# Patient Record
Sex: Male | Born: 1965 | Race: Black or African American | Hispanic: No | Marital: Married | Smoking: Former smoker
Health system: Southern US, Community
[De-identification: ages and names within clinical notes are randomized; demographics above are authoritative.]

## PROBLEM LIST (undated history)

## (undated) DIAGNOSIS — E119 Type 2 diabetes mellitus without complications: Secondary | ICD-10-CM

## (undated) DIAGNOSIS — I10 Essential (primary) hypertension: Secondary | ICD-10-CM

---

## 2010-04-28 ENCOUNTER — Emergency Department: Payer: Self-pay | Admitting: Emergency Medicine

## 2010-05-02 ENCOUNTER — Emergency Department: Payer: Self-pay | Admitting: Unknown Physician Specialty

## 2011-01-17 DIAGNOSIS — F32A Depression, unspecified: Secondary | ICD-10-CM | POA: Diagnosis present

## 2011-01-17 DIAGNOSIS — E785 Hyperlipidemia, unspecified: Secondary | ICD-10-CM | POA: Insufficient documentation

## 2011-01-17 DIAGNOSIS — I10 Essential (primary) hypertension: Secondary | ICD-10-CM | POA: Diagnosis present

## 2011-01-17 DIAGNOSIS — G894 Chronic pain syndrome: Secondary | ICD-10-CM | POA: Diagnosis present

## 2011-01-17 DIAGNOSIS — E119 Type 2 diabetes mellitus without complications: Secondary | ICD-10-CM

## 2011-08-09 DIAGNOSIS — N529 Male erectile dysfunction, unspecified: Secondary | ICD-10-CM | POA: Insufficient documentation

## 2020-05-14 ENCOUNTER — Emergency Department
Admission: EM | Admit: 2020-05-14 | Discharge: 2020-05-15 | Disposition: A | Discharge status: Home / Self Care | Payer: BC Managed Care – PPO | Attending: Emergency Medicine | Admitting: Emergency Medicine

## 2020-05-14 ENCOUNTER — Other Ambulatory Visit: Payer: Self-pay

## 2020-05-14 ENCOUNTER — Emergency Department: Payer: BC Managed Care – PPO

## 2020-05-14 DIAGNOSIS — M549 Dorsalgia, unspecified: Secondary | ICD-10-CM | POA: Insufficient documentation

## 2020-05-14 DIAGNOSIS — Z8739 Personal history of other diseases of the musculoskeletal system and connective tissue: Secondary | ICD-10-CM | POA: Insufficient documentation

## 2020-05-14 DIAGNOSIS — J40 Bronchitis, not specified as acute or chronic: Secondary | ICD-10-CM

## 2020-05-14 DIAGNOSIS — J069 Acute upper respiratory infection, unspecified: Secondary | ICD-10-CM | POA: Insufficient documentation

## 2020-05-14 LAB — COMPREHENSIVE METABOLIC PANEL
ALT: 17 U/L (ref 0–44)
AST: 22 U/L (ref 15–41)
Albumin: 4.1 g/dL (ref 3.5–5.0)
Alkaline Phosphatase: 112 U/L (ref 38–126)
Anion gap: 11 (ref 5–15)
BUN: 7 mg/dL (ref 6–20)
CO2: 22 mmol/L (ref 22–32)
Calcium: 9.4 mg/dL (ref 8.9–10.3)
Chloride: 100 mmol/L (ref 98–111)
Creatinine, Ser: 0.88 mg/dL (ref 0.61–1.24)
GFR, Estimated: >60 mL/min (ref >60)
Glucose, Bld: 232 mg/dL — ABNORMAL HIGH (ref 70–99)
Potassium: 3.7 mmol/L (ref 3.5–5.1)
Sodium: 133 mmol/L — ABNORMAL LOW (ref 135–145)
Total Bilirubin: 0.5 mg/dL (ref 0.3–1.2)
Total Protein: 8.1 g/dL (ref 6.5–8.1)

## 2020-05-14 LAB — CBC
HCT: 41.1 % (ref 39.0–52.0)
Hemoglobin: 13.7 g/dL (ref 13.0–17.0)
MCH: 28.7 pg (ref 26.0–34.0)
MCHC: 33.3 g/dL (ref 30.0–36.0)
MCV: 86 fL (ref 80.0–100.0)
Platelets: 354 10*3/uL (ref 150–400)
RBC: 4.78 MIL/uL (ref 4.22–5.81)
RDW: 12.5 % (ref 11.5–15.5)
WBC: 7.3 10*3/uL (ref 4.0–10.5)
nRBC: 0 % (ref 0.0–0.2)

## 2020-05-14 MED ORDER — SODIUM CHLORIDE 0.9 % IV BOLUS
1000.0000 mL | Freq: Once | INTRAVENOUS | Status: AC
  Administered 2020-05-14: 1000 mL via INTRAVENOUS

## 2020-05-14 MED ORDER — MORPHINE SULFATE (PF) 4 MG/ML IV SOLN
4.0000 mg | Freq: Once | INTRAVENOUS | Status: AC
  Administered 2020-05-14: 4 mg via INTRAVENOUS
  Filled 2020-05-14: qty 1

## 2020-05-14 MED ORDER — IOHEXOL 350 MG/ML SOLN
100.0000 mL | Freq: Once | INTRAVENOUS | Status: AC | PRN
  Administered 2020-05-14: 100 mL via INTRAVENOUS

## 2020-05-14 MED ORDER — ONDANSETRON HCL 4 MG/2ML IJ SOLN
4.0000 mg | Freq: Once | INTRAMUSCULAR | Status: AC
  Administered 2020-05-14: 4 mg via INTRAVENOUS
  Filled 2020-05-14: qty 2

## 2020-05-14 NOTE — ED Triage Notes (Signed)
Patient reports pain from bottom of right buttock up to right mid back.  Denies recently injury, reports history of back surgery.

## 2020-05-14 NOTE — ED Notes (Signed)
Patient transported to CT 

## 2020-05-14 NOTE — ED Notes (Signed)
Pt states right lower back pain that radiates up the back to the right arm. Pt states history of back pain and surgery.

## 2020-05-14 NOTE — ED Provider Notes (Addendum)
[facility] Emergency Department Provider Note  Time seen: 11:26 PM  I have reviewed the triage vital signs and the nursing notes.   HISTORY  Chief Complaint Back Pain   HPI Wesley Bradford is a 54 y.o. male with a past medical history of back surgery 10 years ago presents to the emergency department for back pain.  According to the patient several hours prior to presentation he developed severe right-sided back pain 8/10 in severity radiates from his right buttock up close to his right shoulder per patient.  Patient has a history of back pain with a surgery in 2010 per patient but has not had a significant exacerbation since.  Patient states this is very atypical.  It is worse if he leans backwards per patient.  Denies any radiation into his leg.  No fever.  No vomiting or diarrhea.  No anterior abdominal pain.  No past medical history on file.  There are no problems to display for this patient.   Prior to Admission medications   Not on File    Not on File  No family history on file.  Social History Social History   Tobacco Use  . Smoking status: Not on file  Substance Use Topics  . Alcohol use: Not on file  . Drug use: Not on file    Review of Systems Constitutional: Negative for fever. Cardiovascular: Negative for chest pain. Respiratory: Negative for shortness of breath. Gastrointestinal: Negative for abdominal pain, vomiting and diarrhea. Genitourinary: Negative for urinary compaints Musculoskeletal: Significant right back pain from his right buttock to his right shoulder.  Worse with movement. Skin: Negative for skin complaints  Neurological: Negative for headache All other ROS negative  ____________________________________________   PHYSICAL EXAM:  VITAL SIGNS: ED Triage Vitals  Enc Vitals Group     BP 05/14/20 2233 (!) 157/82     Pulse Rate 05/14/20 2233 (!) 108     Resp 05/14/20 2233 20     Temp 05/14/20 2233 98.8 F (37.1  C)     Temp Source 05/14/20 2233 Oral     SpO2 05/14/20 2233 100 %     Weight 05/14/20 2230 220 lb (99.8 kg)     Height 05/14/20 2230 5\' 10"  (1.778 m)     Head Circumference --      Peak Flow --      Pain Score 05/14/20 2230 8     Pain Loc --      Pain Edu? --      Excl. in GC? --     Constitutional: Alert and oriented.  Leaning over in bed, states worse discomfort if he lies back. Eyes: Normal exam ENT      Head: Normocephalic and atraumatic.      Mouth/Throat: Mucous membranes are moist. Cardiovascular: Normal rate, regular rhythm. Respiratory: Normal respiratory effort without tachypnea nor retractions. Breath sounds are clear Gastrointestinal: Soft and nontender. No distention.   Musculoskeletal: Nontender with normal range of motion in all extremities.  Neurovascularly intact lower extremities bilaterally. Neurologic:  Normal speech and language. No gross focal neurologic deficits Skin:  Skin is warm, dry and intact.  Psychiatric: Mood and affect are normal.   ____________________________________________     RADIOLOGY  CTA is essentially negative for acute abnormality in the abdomen but it does show tree-in-bud airspace opacities.  Patient states he has been coughing over the past 2 weeks.  ____________________________________________   INITIAL IMPRESSION / ASSESSMENT AND PLAN / ED COURSE  Pertinent labs & imaging results that were available during my care of the patient were reviewed by me and considered in my medical decision making (see chart for details).   Patient presents to the emergency department for back pain and right-sided.  Denies any nausea vomiting or fever.  No radiation to the right leg.  It is worse with movement especially if he tries to lean back.  Somewhat relieved if he bends forward.  Patient states a history of low back pain in the past but had a surgery around 2010 per patient has not had an exacerbation since.  States even so this pain feels  very different than when he had back pain prior to the surgery.  Is not on any pain medications at this time.  Given the description of the pain concern would be for possible musculoskeletal pain but also for possible dissection especially given his description of pain radiating up from the right side up into the right shoulder.  There is no abdominal tenderness to palpation.  Special attention paid to the right upper and lower quadrants without tenderness identified.  We will obtain labs and a CTA to further evaluate.  Patient agreeable to plan of care.  We will treat with pain and nausea medication IV hydrate while awaiting results.  CTA shows possible pneumonia/bronchiolitis.  We will cover with antibiotic as a precaution.  Patient's remainder the CT shows no sign of dissection.  We will treat with a prednisone taper and a short course of pain medication.  I discussed return precautions.  Patient agreeable to plan of care.  Wesley Bradford was evaluated in Emergency Department on 05/14/2020 for the symptoms described in the history of present illness. He was evaluated in the context of the global COVID-19 pandemic, which necessitated consideration that the patient might be at risk for infection with the SARS-CoV-2 virus that causes COVID-19. Institutional protocols and algorithms that pertain to the evaluation of patients at risk for COVID-19 are in a state of rapid change based on information released by regulatory bodies including the CDC and federal and state organizations. These policies and algorithms were followed during the patient's care in the ED.  ____________________________________________   FINAL CLINICAL IMPRESSION(S) / ED DIAGNOSES  Back pain Upper respiratory infection   Minna Antis, MD 05/15/20 2353    Minna Antis, MD 05/15/20 [phone]

## 2020-05-15 LAB — URINALYSIS, COMPLETE (UACMP) WITH MICROSCOPIC
Bacteria, UA: NONE SEEN
Bilirubin Urine: NEGATIVE
Glucose, UA: 50 mg/dL — AB
Hgb urine dipstick: NEGATIVE
Ketones, ur: NEGATIVE mg/dL
Leukocytes,Ua: NEGATIVE
Nitrite: NEGATIVE
Protein, ur: NEGATIVE mg/dL
Specific Gravity, Urine: >1.046 — ABNORMAL HIGH (ref 1.005–1.030)
pH: 5 (ref 5.0–8.0)

## 2020-05-15 MED ORDER — AZITHROMYCIN 250 MG PO TABS: ORAL_TABLET | ORAL | 0 refills | Status: AC

## 2020-05-15 MED ORDER — PREDNISONE 10 MG PO TABS: 10.0000 mg | ORAL_TABLET | Freq: Every day | ORAL | 0 refills | Status: DC

## 2020-05-15 MED ORDER — OXYCODONE-ACETAMINOPHEN 5-325 MG PO TABS
1.0000 | ORAL_TABLET | ORAL | 0 refills | Status: DC | PRN
Start: 2020-05-15 — End: 2021-02-17

## 2020-05-15 MED ORDER — DEXAMETHASONE SODIUM PHOSPHATE 10 MG/ML IJ SOLN
10.0000 mg | Freq: Once | INTRAMUSCULAR | Status: AC
  Administered 2020-05-15: 10 mg via INTRAVENOUS
  Filled 2020-05-15: qty 1

## 2020-05-15 MED ORDER — HYDROMORPHONE HCL 1 MG/ML IJ SOLN
1.0000 mg | Freq: Once | INTRAMUSCULAR | Status: AC
  Administered 2020-05-15: 1 mg via INTRAVENOUS
  Filled 2020-05-15: qty 1

## 2020-05-15 NOTE — ED Notes (Signed)
ED Provider at bedside. 

## 2021-02-17 ENCOUNTER — Encounter: Payer: Self-pay | Admitting: Emergency Medicine

## 2021-02-17 ENCOUNTER — Other Ambulatory Visit: Payer: Self-pay

## 2021-02-17 ENCOUNTER — Emergency Department: Payer: BC Managed Care – PPO

## 2021-02-17 ENCOUNTER — Emergency Department
Admission: EM | Admit: 2021-02-17 | Discharge: 2021-02-17 | Disposition: A | Discharge status: Home / Self Care | Payer: BC Managed Care – PPO | Attending: Emergency Medicine | Admitting: Emergency Medicine

## 2021-02-17 DIAGNOSIS — J069 Acute upper respiratory infection, unspecified: Secondary | ICD-10-CM | POA: Diagnosis not present

## 2021-02-17 DIAGNOSIS — R0602 Shortness of breath: Secondary | ICD-10-CM

## 2021-02-17 LAB — BASIC METABOLIC PANEL
Anion gap: 9 (ref 5–15)
BUN: 14 mg/dL (ref 6–20)
CO2: 24 mmol/L (ref 22–32)
Calcium: 9.9 mg/dL (ref 8.9–10.3)
Chloride: 103 mmol/L (ref 98–111)
Creatinine, Ser: 0.88 mg/dL (ref 0.61–1.24)
GFR, Estimated: >60 mL/min (ref >60)
Glucose, Bld: 170 mg/dL — ABNORMAL HIGH (ref 70–99)
Potassium: 3.8 mmol/L (ref 3.5–5.1)
Sodium: 136 mmol/L (ref 135–145)

## 2021-02-17 LAB — CBC
HCT: 38.4 % — ABNORMAL LOW (ref 39.0–52.0)
Hemoglobin: 13 g/dL (ref 13.0–17.0)
MCH: 29.2 pg (ref 26.0–34.0)
MCHC: 33.9 g/dL (ref 30.0–36.0)
MCV: 86.3 fL (ref 80.0–100.0)
Platelets: 280 10*3/uL (ref 150–400)
RBC: 4.45 MIL/uL (ref 4.22–5.81)
RDW: 12.6 % (ref 11.5–15.5)
WBC: 6.8 10*3/uL (ref 4.0–10.5)
nRBC: 0 % (ref 0.0–0.2)

## 2021-02-17 LAB — TROPONIN I (HIGH SENSITIVITY): Troponin I (High Sensitivity): 5 ng/L (ref <18)

## 2021-02-17 MED ORDER — ACETAMINOPHEN 500 MG PO TABS
1000.0000 mg | ORAL_TABLET | Freq: Once | ORAL | Status: AC
  Administered 2021-02-17: 1000 mg via ORAL
  Filled 2021-02-17: qty 2

## 2021-02-17 MED ORDER — IBUPROFEN 400 MG PO TABS
400.0000 mg | ORAL_TABLET | Freq: Once | ORAL | Status: AC
  Administered 2021-02-17: 400 mg via ORAL
  Filled 2021-02-17: qty 1

## 2021-02-17 NOTE — ED Triage Notes (Signed)
Pt reports that he has been having SHOB for the last few days. It is worse when he is up and moving around. He is able to speak in complete full sentences. He was diagnosed  with bronchitis and did not finish his antibiotics and steroids.

## 2021-02-17 NOTE — ED Provider Notes (Signed)
[facility]  ____________________________________________   Event Date/Time   First MD Initiated Contact with Patient 02/17/21 1138     (approximate)  I have reviewed the triage vital signs and the nursing notes.   HISTORY  Chief Complaint Shortness of Breath    HPI Wesley Bradford is a 55 y.o. male past medical history of hypertension, diabetes and tobacco use who presents with shortness of breath.  Symptoms started several days ago.  He endorses cough productive of green sputum as well as dyspnea on exertion.  He went to urgent care and was prescribed medications for bronchitis which included amoxicillin but he does not remember what other medications.  He did not finish these medications.  He endorses some right-sided chest pain that is sharp worse with coughing and breathing.  No fevers or chills.  Denies lower extremity swelling or pain. The patient denies hx of prior DVT/PE, unilateral leg pain/swelling, hormone use, recent surgery, hx of cancer, prolonged immobilization, or hemoptysis.  Denies any cardiac history.  No nausea vomiting or abdominal pain.       History reviewed. No pertinent past medical history.  There are no problems to display for this patient.   History reviewed. No pertinent surgical history.  Prior to Admission medications   Medication Sig Start Date End Date Taking? Authorizing Provider  JANUVIA 100 MG tablet Take 100 mg by mouth daily. 12/08/20   [provider]  lisinopril (ZESTRIL) 40 MG tablet Take 40 mg by mouth daily. 12/06/20   [provider]  metFORMIN (GLUCOPHAGE) 1000 MG tablet Take 1,000 mg by mouth 2 (two) times daily. 12/06/20   [provider]    Allergies Patient has no known allergies.  No family history on file.  Social History    Review of Systems   Review of Systems  Constitutional:  Negative for chills and fever.  Respiratory:  Positive for cough and shortness of breath.    Cardiovascular:  Positive for chest pain. Negative for palpitations and leg swelling.  Gastrointestinal:  Negative for abdominal pain, nausea and vomiting.  Musculoskeletal:  Negative for myalgias.  All other systems reviewed and are negative.  Physical Exam Updated Vital Signs BP 110/79   Pulse 85   Temp 98.6 F (37 C)   Resp 19   Ht 5\' 10"  (1.778 m)   Wt 74.8 kg   SpO2 100%   BMI 23.68 kg/m   Physical Exam Vitals and nursing note reviewed.  Constitutional:      General: He is not in acute distress.    Appearance: Normal appearance.  HENT:     Head: Normocephalic and atraumatic.  Eyes:     General: No scleral icterus.    Conjunctiva/sclera: Conjunctivae normal.  Cardiovascular:     Rate and Rhythm: Normal rate and regular rhythm.  Pulmonary:     Effort: Pulmonary effort is normal. No respiratory distress.     Breath sounds: Normal breath sounds. No wheezing.  Abdominal:     Palpations: Abdomen is soft.     Tenderness: There is no abdominal tenderness.  Musculoskeletal:        General: No deformity or signs of injury.     Cervical back: Normal range of motion.     Right lower leg: No edema.     Left lower leg: No edema.  Skin:    Coloration: Skin is not jaundiced or pale.  Neurological:     General: No focal deficit present.  Mental Status: He is alert and oriented to person, place, and time. Mental status is at baseline.  Psychiatric:        Mood and Affect: Mood normal.        Behavior: Behavior normal.     LABS (all labs ordered are listed, but only abnormal results are displayed)  Labs Reviewed  BASIC METABOLIC PANEL - Abnormal; Notable for the following components:      Result Value   Glucose, Bld 170 (*)    All other components within normal limits  CBC - Abnormal; Notable for the following components:   HCT 38.4 (*)    All other components within normal limits  TROPONIN I (HIGH SENSITIVITY)    ____________________________________________  EKG  Normal sinus rhythm, normal axis, normal intervals, no acute ST or T wave changes, J-point elevation in the lateral precordial leads ____________________________________________  RADIOLOGY I, Randol Kern, personally viewed and evaluated these images (plain radiographs) as part of my medical decision making, as well as reviewing the written report by the radiologist.  ED MD interpretation: I reviewed the chest x-ray which does not show any acute cardiopulmonary process    ____________________________________________   PROCEDURES  Procedure(s) performed (including Critical Care):  Procedures   ____________________________________________   INITIAL IMPRESSION / ASSESSMENT AND PLAN / ED COURSE     This patient is a 55 year old male with past medical history of diabetes and hypertension who presents with shortness of breath and cough.  His vital signs are normal saturating 100% on room air and normal heart rate.  He is not wheezing.  His EKG does not show any ischemic changes.  His chest pain is very atypical for ACS.  I considered pulmonary embolism given the pleuritic nature of the pain however he has no risk factors is not tachycardic and not hypoxic and I feel that this is unlikely.  His troponin is 5 and labs otherwise reassuring.  Chest x-ray does not show any pneumonia or other process.  Suspect viral URI.  Will discharge.  We discussed return precautions for worsening chest pain or dyspnea.  Clinical Course as of 02/17/21 1249  Fri Feb 17, 2021  1244 Troponin I (High Sensitivity): 5 [KM]    Clinical Course User Index [KM] Georga Hacking, MD     ____________________________________________   FINAL CLINICAL IMPRESSION(S) / ED DIAGNOSES  Final diagnoses:  Upper respiratory tract infection, unspecified type  Shortness of breath     ED Discharge Orders     None        Note:  This document was  prepared using Dragon voice recognition software and may include unintentional dictation errors.    Georga Hacking, MD 02/17/21 1249

## 2021-04-23 ENCOUNTER — Emergency Department
Admission: EM | Admit: 2021-04-23 | Discharge: 2021-04-23 | Disposition: A | Discharge status: Home / Self Care | Payer: BC Managed Care – PPO | Attending: Emergency Medicine | Admitting: Emergency Medicine

## 2021-04-23 ENCOUNTER — Emergency Department: Payer: BC Managed Care – PPO

## 2021-04-23 ENCOUNTER — Other Ambulatory Visit: Payer: Self-pay

## 2021-04-23 DIAGNOSIS — Z79899 Other long term (current) drug therapy: Secondary | ICD-10-CM | POA: Insufficient documentation

## 2021-04-23 DIAGNOSIS — E119 Type 2 diabetes mellitus without complications: Secondary | ICD-10-CM | POA: Diagnosis not present

## 2021-04-23 DIAGNOSIS — M79642 Pain in left hand: Secondary | ICD-10-CM | POA: Diagnosis not present

## 2021-04-23 DIAGNOSIS — M7989 Other specified soft tissue disorders: Secondary | ICD-10-CM | POA: Diagnosis not present

## 2021-04-23 DIAGNOSIS — S60222A Contusion of left hand, initial encounter: Secondary | ICD-10-CM | POA: Diagnosis not present

## 2021-04-23 DIAGNOSIS — W01198A Fall on same level from slipping, tripping and stumbling with subsequent striking against other object, initial encounter: Secondary | ICD-10-CM | POA: Diagnosis not present

## 2021-04-23 DIAGNOSIS — Z7984 Long term (current) use of oral hypoglycemic drugs: Secondary | ICD-10-CM | POA: Diagnosis not present

## 2021-04-23 DIAGNOSIS — I10 Essential (primary) hypertension: Secondary | ICD-10-CM | POA: Diagnosis not present

## 2021-04-23 DIAGNOSIS — F17210 Nicotine dependence, cigarettes, uncomplicated: Secondary | ICD-10-CM | POA: Insufficient documentation

## 2021-04-23 DIAGNOSIS — S6992XA Unspecified injury of left wrist, hand and finger(s), initial encounter: Secondary | ICD-10-CM | POA: Diagnosis present

## 2021-04-23 HISTORY — DX: Type 2 diabetes mellitus without complications: E11.9

## 2021-04-23 HISTORY — DX: Essential (primary) hypertension: I10

## 2021-04-23 NOTE — ED Notes (Signed)
See triage note  presents s/p trip and fall  states missed a step and fell forward on steps  caught himself with left hand

## 2021-04-23 NOTE — ED Triage Notes (Signed)
Pt comes pov with left hand injury after tripping and falling the other day. Pt top of hand swollen. Also has lac to inside of palm. Not bleeding at this time. Able to wiggle fingers.

## 2021-04-23 NOTE — ED Provider Notes (Signed)
[facility] Emergency Department Provider Note ____________________________________________  Time seen: 1245  I have reviewed the triage vital signs and the nursing notes.  HISTORY  Chief Complaint  Hand Injury   HPI Wesley Bradford is a 55 y.o. male presents to the ER today with complaint of left hand pain and swelling as well as a laceration.  He reports a fall that occurred 2 days ago.  He reports he tripped and fell, and tried to catch himself with his left hand.  He describes the pain as sharp.  The pain is worse with trying to grasp, lift or carry anything.  He denies numbness or tingling but has had some weakness in the left hand.  He cleansed the laceration with Hydrogen Peroxide.  He has taken naproxen for the hand pain and swelling.  He reports his tetanus shot has been within the last 10 years.  Past Medical History:  Diagnosis Date   Diabetes mellitus without complication (HCC)    Hypertension     There are no problems to display for this patient.   History reviewed. No pertinent surgical history.  Prior to Admission medications   Medication Sig Start Date End Date Taking? Authorizing Provider  JANUVIA 100 MG tablet Take 100 mg by mouth daily. 12/08/20   [provider]  lisinopril (ZESTRIL) 40 MG tablet Take 40 mg by mouth daily. 12/06/20   [provider]  metFORMIN (GLUCOPHAGE) 1000 MG tablet Take 1,000 mg by mouth 2 (two) times daily. 12/06/20   [provider]    Allergies Patient has no known allergies.  History reviewed. No pertinent family history.  Social History Social History   Tobacco Use   Smoking status: Every Day    Types: Cigarettes   Smokeless tobacco: Never  Substance Use Topics   Alcohol use: Yes   Drug use: Never    Review of Systems  Constitutional: Negative for fever, chills or body aches. Cardiovascular: Negative for chest pain or chest tightness. Respiratory: Negative for cough or  shortness of breath. Musculoskeletal: Positive for left hand pain and swelling.  Negative for wrist, elbow or shoulder pain. Skin: Positive for laceration to left palm. Neurological: Positive for weakness of the left hand.  Negative for tingling or numbness. ____________________________________________  PHYSICAL EXAM:  VITAL SIGNS: ED Triage Vitals  Enc Vitals Group     BP 04/23/21 1228 (!) 156/85     Pulse Rate 04/23/21 1228 79     Resp 04/23/21 1228 18     Temp 04/23/21 1228 98.2 F (36.8 C)     Temp Source 04/23/21 1228 Oral     SpO2 04/23/21 1228 98 %     Weight 04/23/21 1227 190 lb (86.2 kg)     Height 04/23/21 1227 5\' 10"  (1.778 m)     Head Circumference --      Peak Flow --      Pain Score 04/23/21 1227 7     Pain Loc --      Pain Edu? --      Excl. in GC? --     Constitutional: Alert and oriented. Well appearing and in no distress. Head: Normocephalic and atraumatic. Eyes:  Normal extraocular movements Cardiovascular: Normal rate, regular rhythm.  Radial pulses 2+ bilaterally. Respiratory: Normal respiratory effort. No wheezes/rales/rhonchi noted. Musculoskeletal: Normal flexion, extension and rotation of the left wrist.  Decreased flexion and extension of the fingers of the left hand secondary to pain and swelling.  2+ swelling  noted over the second third and fourth metacarpals.  Pain with palpation over the second, third, fourth and fifth metacarpals.  Handgrips unequal, R >L. Neurologic:  Normal speech and language.  Decreased fine motor coordination of the left hand secondary to pain and swelling.  Regions. Skin:  Skin is warm, dry and intact.  2 cm laceration noted over the distal palmar third and fourth metacarpals.  Contusion noted of the entire palm of the left hand. ____________________________________________  RADIOLOGY  Imaging Orders         DG Hand Complete Left    IMPRESSION: Soft tissue swelling. No acute fracture or dislocation involving the left  hand. Tiny amount of bony irregularity at the base of the fourth proximal phalanx likely represents degenerative change or sequela of remote prior injury.  ____________________________________________  PROCEDURES  .Ortho Injury Treatment  Date/Time: 04/23/2021 1:18 PM Performed by: Marilynn Rail, NT Authorized by: Lorre Munroe, NP   Consent:    Consent obtained:  Verbal   Consent given by:  Patient   Risks discussed:  Restricted joint movement   Alternatives discussed:  No treatmentInjury location: hand Location details: left hand Pre-procedure neurovascular assessment: neurovascularly intact Pre-procedure distal perfusion: normal Pre-procedure neurological function: normal Pre-procedure range of motion: reduced  Anesthesia: Local anesthesia used: no  Patient sedated: NoImmobilization: splint Splint type: volar short arm Splint Applied by: ED Tech Supplies used: Ortho-Glass Post-procedure neurovascular assessment: post-procedure neurovascularly intact Post-procedure distal perfusion: normal Post-procedure neurological function: normal Post-procedure range of motion: unchanged    ____________________________________________  INITIAL IMPRESSION / ASSESSMENT AND PLAN / ED COURSE  Left Hand Pain and Swelling s/p Fall:  Xray left hand shows no acute fracture per radiology read however there is concern for hairline fracture of the 4th and 5th metacarpals. Placed in Volar splint for protection Unable to repair laceration as it has been > 24 hours Continue Naproxen OTC as needed No need for tetatnus shot per patient report Will have him follow up with ortho as an outpatient  ____________________________________________  FINAL CLINICAL IMPRESSION(S) / ED DIAGNOSES  Final diagnoses:  Left hand pain  Swelling of left hand  Contusion of left hand, initial encounter      Lorre Munroe, NP 04/23/21 1321    Delton Prairie, MD 04/23/21 1511

## 2021-04-23 NOTE — Discharge Instructions (Addendum)
You were seen today for left hand pain, swelling and laceration status post a fall.  The laceration was not able to be repaired as this occurred greater than 24 hours ago.  Your x-ray said that there was no fracture however there is slight concern for hairline fracture of the fourth and fifth metacarpals.  We have placed you in a splint for protection.  We encourage elevation and anti-inflammatory such as Aleve 220 mg 2 times a day for pain and inflammation.  Please follow-up with orthopedics for further evaluation.  If they deem that there is no fracture, they will take the splint off.

## 2021-04-23 NOTE — ED Notes (Signed)
Pt verbalized understanding of dc instructions. No questions or concerns at this time. Will call ortho tomorrow to make follow up appt. Pt walked with steady gait to lobby with spouse.

## 2021-12-13 ENCOUNTER — Emergency Department
Admission: EM | Admit: 2021-12-13 | Discharge: 2021-12-13 | Disposition: A | Discharge status: Home / Self Care | Payer: BC Managed Care – PPO | Attending: Emergency Medicine | Admitting: Emergency Medicine

## 2021-12-13 ENCOUNTER — Other Ambulatory Visit: Payer: Self-pay

## 2021-12-13 ENCOUNTER — Telehealth: Payer: Self-pay | Admitting: Family Medicine

## 2021-12-13 ENCOUNTER — Emergency Department: Payer: BC Managed Care – PPO

## 2021-12-13 DIAGNOSIS — M5432 Sciatica, left side: Secondary | ICD-10-CM

## 2021-12-13 DIAGNOSIS — M5442 Lumbago with sciatica, left side: Secondary | ICD-10-CM | POA: Diagnosis not present

## 2021-12-13 DIAGNOSIS — M545 Low back pain, unspecified: Secondary | ICD-10-CM | POA: Diagnosis present

## 2021-12-13 DIAGNOSIS — I10 Essential (primary) hypertension: Secondary | ICD-10-CM | POA: Insufficient documentation

## 2021-12-13 DIAGNOSIS — E119 Type 2 diabetes mellitus without complications: Secondary | ICD-10-CM | POA: Diagnosis not present

## 2021-12-13 MED ORDER — HYDROCODONE-ACETAMINOPHEN 5-325 MG PO TABS: 1.0000 | ORAL_TABLET | Freq: Four times a day (QID) | ORAL | 0 refills | Status: DC | PRN

## 2021-12-13 MED ORDER — PREDNISONE 10 MG (21) PO TBPK: ORAL_TABLET | ORAL | 0 refills | Status: DC

## 2021-12-13 MED ORDER — HYDROCODONE-ACETAMINOPHEN 5-325 MG PO TABS: 1.0000 | ORAL_TABLET | Freq: Four times a day (QID) | ORAL | 0 refills | Status: AC | PRN

## 2021-12-13 MED ORDER — KETOROLAC TROMETHAMINE 60 MG/2ML IM SOLN
30.0000 mg | Freq: Once | INTRAMUSCULAR | Status: AC
  Administered 2021-12-13: 30 mg via INTRAMUSCULAR
  Filled 2021-12-13: qty 2

## 2021-12-13 NOTE — Telephone Encounter (Cosign Needed)
[facility] out of hydrocodone. Sent to [facility] in [place]

## 2021-12-13 NOTE — ED Triage Notes (Signed)
Pt states that he thinks he may have picked up something at work, pt reports that he has had back surg in the past, states for the past 2 days he has been having left sided back pain that radiates into his left leg

## 2021-12-13 NOTE — ED Provider Notes (Signed)
[facility] Provider Note    Event Date/Time   First MD Initiated Contact with Patient 12/13/21 1025     (approximate)   History   Back Pain   HPI  Wesley Bradford is a 56 y.o. male  with history as listed below presents to the ER for evaluation of pain on left side lower back after lifting something at work a few days ago. He does not recall exactly what it was. Pain travels down the left buttock and into the left lower leg. History of back surgery. No relief with Tylenol.  Past Medical History:  Diagnosis Date   Diabetes mellitus without complication (HCC)    Hypertension         Physical Exam   Triage Vital Signs: ED Triage Vitals  Enc Vitals Group     BP 12/13/21 0947 133/77     Pulse Rate 12/13/21 0947 86     Resp 12/13/21 0947 16     Temp 12/13/21 0947 98.1 F (36.7 C)     Temp Source 12/13/21 0947 Oral     SpO2 12/13/21 0947 100 %     Weight 12/13/21 0948 178 lb (80.7 kg)     Height 12/13/21 0948 5\' 10"  (1.778 m)     Head Circumference --      Peak Flow --      Pain Score 12/13/21 0948 10     Pain Loc --      Pain Edu? --      Excl. in GC? --     Most recent vital signs: Vitals:   12/13/21 0947  BP: 133/77  Pulse: 86  Resp: 16  Temp: 98.1 F (36.7 C)  SpO2: 100%    General: Awake, no distress.  CV:  Good peripheral perfusion.  Resp:  Normal effort.  Abd:  No distention.  Other:  No weakness of left lower extremity.    ED Results / Procedures / Treatments   Labs (all labs ordered are listed, but only abnormal results are displayed) Labs Reviewed - No data to display   EKG     RADIOLOGY  Images of the lumbar spine shows no acute findings.  Previous L4-S1 fusion. 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  I have independently reviewed and interpreted imaging as well as reviewed report from radiology.  PROCEDURES:  Critical Care performed:  No  Procedures   MEDICATIONS ORDERED IN ED:  Medications  ketorolac (TORADOL) injection 30 mg (30 mg Intramuscular Given 12/13/21 1037)     IMPRESSION / MDM / ASSESSMENT AND PLAN / ED COURSE   I reviewed the triage vital signs and the nursing notes.                              Differential diagnosis includes, but is not limited to: Lumbar degenerative disc disease, sciatica, muscle strain  Patient's presentation is most consistent with acute, uncomplicated illness.  56 year old male presenting to the emergency department for treatment and evaluation of left side back pain that radiates down the left leg.  See HPI for further details.  Patient screened for red flags of back pain.  None noted.  X-ray shows no acute concerns.  Toradol was given IM here with significant relief.  Patient will be discharged home with a tapered dose of prednisone and a short course of hydrocodone.  He was encouraged to also take ibuprofen  instead of the hydrocodone for moderate pain.  If symptoms or not improving over the week or for symptoms that change or worsen, he is to see orthopedics, primary care, or return to the emergency department.      FINAL CLINICAL IMPRESSION(S) / ED DIAGNOSES   Final diagnoses:  Sciatica of left side     Rx / DC Orders   ED Discharge Orders          Ordered    predniSONE (STERAPRED UNI-PAK 21 TAB) 10 MG (21) TBPK tablet        12/13/21 1143    HYDROcodone-acetaminophen (NORCO/VICODIN) 5-325 MG tablet  Every 6 hours PRN        12/13/21 1143             Note:  This document was prepared using Dragon voice recognition software and may include unintentional dictation errors.   Chinita Pester, FNP 12/13/21 1215    Merwyn Katos, MD 12/13/21 [phone]

## 2021-12-13 NOTE — ED Notes (Signed)
dc ppw provided. followup and rx information reviewed as applicable. Pt questions addressed at this time. Pt declines VS at time of DC and provides verbal consent for DC. Pt in wheelchair to lobby alert and oriented.

## 2021-12-18 ENCOUNTER — Emergency Department
Admission: EM | Admit: 2021-12-18 | Discharge: 2021-12-19 | Disposition: A | Discharge status: Home / Self Care | Payer: BC Managed Care – PPO | Attending: Emergency Medicine | Admitting: Emergency Medicine

## 2021-12-18 ENCOUNTER — Encounter: Payer: Self-pay | Admitting: Emergency Medicine

## 2021-12-18 ENCOUNTER — Other Ambulatory Visit: Payer: Self-pay

## 2021-12-18 ENCOUNTER — Emergency Department: Payer: BC Managed Care – PPO

## 2021-12-18 DIAGNOSIS — M5432 Sciatica, left side: Secondary | ICD-10-CM | POA: Diagnosis not present

## 2021-12-18 DIAGNOSIS — E119 Type 2 diabetes mellitus without complications: Secondary | ICD-10-CM | POA: Insufficient documentation

## 2021-12-18 DIAGNOSIS — I10 Essential (primary) hypertension: Secondary | ICD-10-CM | POA: Diagnosis not present

## 2021-12-18 DIAGNOSIS — W108XXA Fall (on) (from) other stairs and steps, initial encounter: Secondary | ICD-10-CM | POA: Diagnosis not present

## 2021-12-18 DIAGNOSIS — M7918 Myalgia, other site: Secondary | ICD-10-CM | POA: Diagnosis present

## 2021-12-18 MED ORDER — IBUPROFEN 600 MG PO TABS
600.0000 mg | ORAL_TABLET | Freq: Once | ORAL | Status: AC
Start: 2021-12-18 — End: 2021-12-18
  Administered 2021-12-18: 600 mg via ORAL
  Filled 2021-12-18: qty 1

## 2021-12-18 NOTE — ED Triage Notes (Signed)
Pt presents via pov with c/o left sided back pain that radiates down into left leg. Pt last seen on Tuesday with same pain. Pt reports new injury since then, today has fallen down steps due to leg "giving away". Pt reports falling on tail bone and back. Pt denies LOC with fall or hitting head.

## 2021-12-19 MED ORDER — TRAMADOL HCL 50 MG PO TABS
50.0000 mg | ORAL_TABLET | Freq: Once | ORAL | Status: AC
  Administered 2021-12-19: 50 mg via ORAL
  Filled 2021-12-19: qty 1

## 2021-12-19 MED ORDER — TRAMADOL HCL 50 MG PO TABS: 50.0000 mg | ORAL_TABLET | Freq: Four times a day (QID) | ORAL | 0 refills | Status: AC | PRN

## 2021-12-19 MED ORDER — ACETAMINOPHEN 500 MG PO TABS
1000.0000 mg | ORAL_TABLET | Freq: Once | ORAL | Status: AC
  Administered 2021-12-19: 1000 mg via ORAL
  Filled 2021-12-19: qty 2

## 2021-12-19 NOTE — ED Notes (Signed)
E-signature pad unavailable - Pt verbalized understanding of D/C information - no additional concerns at this time.  

## 2021-12-19 NOTE — ED Provider Notes (Signed)
[facility] Provider Note    Event Date/Time   First MD Initiated Contact with Patient 12/18/21 2340     (approximate)   History   Back Pain   HPI  Wesley Bradford is a 56 y.o. male with a history of chronic back pain, diabetes, hypertension who presents for evaluation of exacerbation of his pain.  Patient had back surgery in 2008.  Since then has had chronic pain.  Was seen here a week ago after the pain was exacerbated by lifting something heavy at work.  He was discharged home on some steroids and Percocet.  Today he reports that he was coming down the stairs when he slipped and fell onto his buttock.  That made the pain worse.  He does not have any more Percocets at home.  He denies saddle anesthesia, urinary or bowel incontinence or retention, lower extremity weakness.  He does report some intermittent numbness of his left lower extremity which is unchanged.  His pain is located in the left buttock and radiate down into his leg as a sharp shocking pain.  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 No pertinent surgical history.   Physical Exam   Triage Vital Signs: ED Triage Vitals  Enc Vitals Group     BP 12/18/21 2031 (!) 174/82     Pulse Rate 12/18/21 2031 98     Resp 12/18/21 2031 16     Temp 12/18/21 2031 98.4 F (36.9 C)     Temp Source 12/18/21 2358 Oral     SpO2 12/18/21 2031 100 %     Weight 12/18/21 2031 178 lb (80.7 kg)     Height 12/18/21 2031 5\' 10"  (1.778 m)     Head Circumference --      Peak Flow --      Pain Score 12/18/21 2031 10     Pain Loc --      Pain Edu? --      Excl. in GC? --     Most recent vital signs: Vitals:   12/18/21 2031 12/18/21 2358  BP: (!) 174/82 (!) 152/80  Pulse: 98 74  Resp: 16 18  Temp: 98.4 F (36.9 C) 98 F (36.7 C)  SpO2:  100% 99%     Constitutional: Alert and oriented. No acute distress. Does not appear intoxicated. HEENT Head: Normocephalic and atraumatic. Face: No facial bony tenderness. Stable midface Ears: No hemotympanum bilaterally. No Battle sign Eyes: No eye injury. PERRL. No raccoon eyes Nose: Nontender. No epistaxis. No rhinorrhea Mouth/Throat: Mucous membranes are moist. No oropharyngeal blood. No dental injury. Airway patent without stridor. Normal voice. Neck: no C-collar. No midline c-spine tenderness.  Cardiovascular: Normal rate, regular rhythm. Normal and symmetric distal pulses are present in all extremities. Pulmonary/Chest: Chest wall is stable and nontender to palpation/compression. Normal respiratory effort. Breath sounds are normal. No crepitus.  Abdominal: Soft, nontender, non distended. Musculoskeletal: Patient is tender to palpation over the left sciatic notch.  nontender with normal full range of motion in all extremities. No deformities. No thoracic or lumbar midline spinal tenderness. Pelvis is stable. Skin: Skin is warm, dry and intact. No abrasions or contutions. Psychiatric: Speech and behavior are appropriate. Neurological: Normal speech and language.  2+ patella DTRs in bilateral lower extremities,  intact strength and sensation  ED Results / Procedures / Treatments   Labs (all labs ordered are listed, but only abnormal results are displayed) Labs Reviewed - No data to display   EKG  none   RADIOLOGY I, Nita Sicklearolina Dot Splinter, attending MD, have personally viewed and interpreted the images obtained during this visit as below:  X-ray with no acute pathology   ___________________________________________________ Interpretation by Radiologist:  DG Lumbar Spine 2-3 Views  Result Date: 12/18/2021 CLINICAL DATA:  Left-sided back pain EXAM: LUMBAR SPINE - 2-3 VIEW COMPARISON:  12/13/2021 FINDINGS: Scoliosis of the lumbar spine. Posterior spinal hardware L4 through S1  with interbody device at L5-S1. Both screws at S1 are fractured as before. Grade 1 anterolisthesis L3 on L4. Multilevel degenerative changes with severe disc space narrowing and degenerative change L1-L2. IMPRESSION: 1. No acute osseous abnormality. 2. 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 Multilevel degenerative changes Electronically Signed   By: Jasmine PangKim  Fujinaga M.D.   On: 12/18/2021 21:06       PROCEDURES:  Critical Care performed: No  Procedures    IMPRESSION / MDM / ASSESSMENT AND PLAN / ED COURSE  I reviewed the triage vital signs and the nursing notes.  56 y.o. male with a history of chronic back pain, diabetes, hypertension who presents for evaluation of exacerbation of his pain after slipping and falling on his buttock.  Patient has no midline spine tenderness.  No neurological deficits.  The pain description is classic of sciatica pain.  Lumbar x-rays were done showing no acute pathology.  We recommended follow-up with orthopedics since patient has been having progressively worsening pain for a while now.  We will give him a short course of tramadol for pain.  I did explain to the patient that he has been seen here twice in a week and received narcotic pain prescriptions.  We will no longer be able to provide him with any more narcotics.  He needs to see his primary care doctor for control of chronic pain and physical therapy.  Discussed my standard return precautions for any saddle anesthesia, urinary or bowel incontinence or retention, or any other concerning findings.  Currently there is no clinical signs of cauda equina   MEDICATIONS GIVEN IN ED: Medications  ibuprofen (ADVIL) tablet 600 mg (600 mg Oral Given 12/18/21 2044)  acetaminophen (TYLENOL) tablet 1,000 mg (1,000 mg Oral Given 12/19/21 0100)  traMADol (ULTRAM) tablet 50 mg (50 mg Oral Given 12/19/21 0100)    EMR reviewed including records from his last visit with  his primary care doctor from October 2022 for hypertension BPH and diabetes    FINAL CLINICAL IMPRESSION(S) / ED DIAGNOSES   Final diagnoses:  Sciatica of left side     Rx / DC Orders   ED Discharge Orders          Ordered    traMADol (ULTRAM) 50 MG tablet  Every 6 hours PRN        12/19/21 0052    AMB referral to orthopedics       Comments: Sciatica , h/o of prior back surgery in 2008   12/19/21 0053             Note:  This document was prepared using Dragon voice recognition software and may include unintentional dictation errors.   Please note:  Patient was evaluated in Emergency Department today for the symptoms described in the history of present illness. Patient was evaluated in  the context of the global COVID-19 pandemic, which necessitated consideration that the patient might be at risk for infection with the SARS-CoV-2 virus that causes COVID-19. Institutional protocols and algorithms that pertain to the evaluation of patients at risk for COVID-19 are in a state of rapid change based on information released by regulatory bodies including the CDC and federal and state organizations. These policies and algorithms were followed during the patient's care in the ED.  Some ED evaluations and interventions may be delayed as a result of limited staffing during the pandemic.       Don Perking, [place], MD 12/19/21 [phone]

## 2021-12-25 ENCOUNTER — Other Ambulatory Visit: Payer: Self-pay | Admitting: Orthopedic Surgery

## 2021-12-25 DIAGNOSIS — M4326 Fusion of spine, lumbar region: Secondary | ICD-10-CM

## 2021-12-25 DIAGNOSIS — M5416 Radiculopathy, lumbar region: Secondary | ICD-10-CM

## 2022-01-03 ENCOUNTER — Ambulatory Visit
Admission: RE | Admit: 2022-01-03 | Discharge: 2022-01-03 | Disposition: A | Discharge status: Home / Self Care | Payer: BC Managed Care – PPO | Source: Ambulatory Visit | Attending: Orthopedic Surgery | Admitting: Orthopedic Surgery

## 2022-01-03 DIAGNOSIS — M4326 Fusion of spine, lumbar region: Secondary | ICD-10-CM

## 2022-01-03 DIAGNOSIS — M5416 Radiculopathy, lumbar region: Secondary | ICD-10-CM | POA: Diagnosis present

## 2022-01-11 DIAGNOSIS — M5416 Radiculopathy, lumbar region: Secondary | ICD-10-CM | POA: Insufficient documentation

## 2022-01-30 ENCOUNTER — Other Ambulatory Visit: Payer: Self-pay | Admitting: Family Medicine

## 2022-01-30 DIAGNOSIS — M5416 Radiculopathy, lumbar region: Secondary | ICD-10-CM

## 2022-02-02 ENCOUNTER — Other Ambulatory Visit: Payer: Self-pay | Admitting: Family Medicine

## 2022-02-02 ENCOUNTER — Ambulatory Visit
Admission: RE | Admit: 2022-02-02 | Discharge: 2022-02-02 | Disposition: A | Discharge status: Home / Self Care | Payer: BC Managed Care – PPO | Source: Ambulatory Visit | Attending: Family Medicine | Admitting: Family Medicine

## 2022-02-02 DIAGNOSIS — M5416 Radiculopathy, lumbar region: Secondary | ICD-10-CM

## 2022-02-02 MED ORDER — METHYLPREDNISOLONE ACETATE 40 MG/ML INJ SUSP (RADIOLOG
80.0000 mg | Freq: Once | INTRAMUSCULAR | Status: AC
  Administered 2022-02-02: 80 mg via EPIDURAL

## 2022-02-02 MED ORDER — IOPAMIDOL (ISOVUE-M 200) INJECTION 41%
1.0000 mL | Freq: Once | INTRAMUSCULAR | Status: AC
  Administered 2022-02-02: 1 mL via EPIDURAL

## 2022-02-02 NOTE — Discharge Instructions (Signed)

## 2022-03-22 DIAGNOSIS — M48061 Spinal stenosis, lumbar region without neurogenic claudication: Secondary | ICD-10-CM | POA: Diagnosis not present

## 2022-03-22 DIAGNOSIS — Z9889 Other specified postprocedural states: Secondary | ICD-10-CM | POA: Diagnosis not present

## 2022-03-22 DIAGNOSIS — M5416 Radiculopathy, lumbar region: Secondary | ICD-10-CM | POA: Diagnosis not present

## 2022-04-16 DIAGNOSIS — M48062 Spinal stenosis, lumbar region with neurogenic claudication: Secondary | ICD-10-CM | POA: Insufficient documentation

## 2022-06-08 DIAGNOSIS — Z419 Encounter for procedure for purposes other than remedying health state, unspecified: Secondary | ICD-10-CM | POA: Diagnosis not present

## 2022-07-02 ENCOUNTER — Emergency Department: Payer: BC Managed Care – PPO

## 2022-07-02 ENCOUNTER — Emergency Department
Admission: EM | Admit: 2022-07-02 | Discharge: 2022-07-02 | Disposition: A | Discharge status: Home / Self Care | Payer: BC Managed Care – PPO | Attending: Emergency Medicine | Admitting: Emergency Medicine

## 2022-07-02 DIAGNOSIS — J101 Influenza due to other identified influenza virus with other respiratory manifestations: Secondary | ICD-10-CM | POA: Insufficient documentation

## 2022-07-02 DIAGNOSIS — R0602 Shortness of breath: Secondary | ICD-10-CM | POA: Diagnosis present

## 2022-07-02 DIAGNOSIS — R051 Acute cough: Secondary | ICD-10-CM

## 2022-07-02 DIAGNOSIS — Z1152 Encounter for screening for COVID-19: Secondary | ICD-10-CM | POA: Diagnosis not present

## 2022-07-02 LAB — CBC WITH DIFFERENTIAL/PLATELET
Abs Immature Granulocytes: 0.02 10*3/uL (ref 0.00–0.07)
Basophils Absolute: 0 10*3/uL (ref 0.0–0.1)
Basophils Relative: 0 %
Eosinophils Absolute: 0.1 10*3/uL (ref 0.0–0.5)
Eosinophils Relative: 2 %
HCT: 36.6 % — ABNORMAL LOW (ref 39.0–52.0)
Hemoglobin: 12.1 g/dL — ABNORMAL LOW (ref 13.0–17.0)
Immature Granulocytes: 0 %
Lymphocytes Relative: 12 %
Lymphs Abs: 0.7 10*3/uL (ref 0.7–4.0)
MCH: 28.3 pg (ref 26.0–34.0)
MCHC: 33.1 g/dL (ref 30.0–36.0)
MCV: 85.7 fL (ref 80.0–100.0)
Monocytes Absolute: 0.8 10*3/uL (ref 0.1–1.0)
Monocytes Relative: 12 %
Neutro Abs: 4.6 10*3/uL (ref 1.7–7.7)
Neutrophils Relative %: 74 %
Platelets: 202 10*3/uL (ref 150–400)
RBC: 4.27 MIL/uL (ref 4.22–5.81)
RDW: 12.3 % (ref 11.5–15.5)
WBC: 6.2 10*3/uL (ref 4.0–10.5)
nRBC: 0 % (ref 0.0–0.2)

## 2022-07-02 LAB — RESP PANEL BY RT-PCR (RSV, FLU A&B, COVID)  RVPGX2
Influenza A by PCR: POSITIVE — AB
Influenza B by PCR: NEGATIVE
Resp Syncytial Virus by PCR: NEGATIVE
SARS Coronavirus 2 by RT PCR: NEGATIVE

## 2022-07-02 LAB — COMPREHENSIVE METABOLIC PANEL
ALT: 24 U/L (ref 0–44)
AST: 24 U/L (ref 15–41)
Albumin: 3.8 g/dL (ref 3.5–5.0)
Alkaline Phosphatase: 70 U/L (ref 38–126)
Anion gap: 9 (ref 5–15)
BUN: 8 mg/dL (ref 6–20)
CO2: 23 mmol/L (ref 22–32)
Calcium: 8.8 mg/dL — ABNORMAL LOW (ref 8.9–10.3)
Chloride: 101 mmol/L (ref 98–111)
Creatinine, Ser: 0.74 mg/dL (ref 0.61–1.24)
GFR, Estimated: >60 mL/min (ref >60)
Glucose, Bld: 171 mg/dL — ABNORMAL HIGH (ref 70–99)
Potassium: 4.1 mmol/L (ref 3.5–5.1)
Sodium: 133 mmol/L — ABNORMAL LOW (ref 135–145)
Total Bilirubin: 0.7 mg/dL (ref 0.3–1.2)
Total Protein: 6.8 g/dL (ref 6.5–8.1)

## 2022-07-02 LAB — TROPONIN I (HIGH SENSITIVITY): Troponin I (High Sensitivity): 8 ng/L (ref <18)

## 2022-07-02 MED ORDER — ACETAMINOPHEN 325 MG PO TABS
650.0000 mg | ORAL_TABLET | Freq: Once | ORAL | Status: AC | PRN
Start: 2022-07-02 — End: 2022-07-02
  Administered 2022-07-02: 650 mg via ORAL
  Filled 2022-07-02: qty 2

## 2022-07-02 NOTE — ED Triage Notes (Signed)
SOB and Cough x 1.5 weeks. Non productive cough.  Denies fever.  No antipyretics since yesterday.

## 2022-07-02 NOTE — ED Notes (Signed)
Pt Dc to home. Assisted out of dept via wheelchair with family member

## 2022-07-02 NOTE — Discharge Instructions (Signed)
Please take Tylenol and ibuprofen/Advil for your pain.  It is safe to take them together, or to alternate them every few hours.  Take up to 1000mg of Tylenol at a time, up to 4 times per day.  Do not take more than 4000 mg of Tylenol in 24 hours.  For ibuprofen, take 400-600 mg, 3 - 4 times per day.  

## 2022-07-02 NOTE — ED Provider Notes (Signed)
[facility] Provider Note    None    (approximate)   History   Shortness of Breath   HPI  Wesley Bradford is a 56 y.o. male who presents to the ED for evaluation of Shortness of Breath   Patient presents to the ED with his wife for evaluation of a worsening cough.  Reports having a "cold" for the past 1.5 weeks with a mild nonproductive cough and some upper respiratory congestion.  Past 24 hours ago, his symptoms seem to worsen with a stronger nonproductive cough, subjective chills, poor appetite and with nausea.  He has developed some lower anterior chest discomfort associated with his coughing that gets better when he is ambulatory.  No trauma.  No syncope.   Physical Exam   Triage Vital Signs: ED Triage Vitals  Enc Vitals Group     BP 07/02/22 0312 (!) 157/72     Pulse Rate 07/02/22 0311 99     Resp 07/02/22 0311 20     Temp 07/02/22 0311 (!) 100.6 F (38.1 C)     Temp Source 07/02/22 0311 Oral     SpO2 07/02/22 0311 95 %     Weight 07/02/22 0312 210 lb (95.3 kg)     Height 07/02/22 0312 5\' 10"  (1.778 m)     Head Circumference --      Peak Flow --      Pain Score --      Pain Loc --      Pain Edu? --      Excl. in GC? --     Most recent vital signs: Vitals:   07/02/22 0311 07/02/22 0312  BP:  (!) 157/72  Pulse: 99   Resp: 20   Temp: (!) 100.6 F (38.1 C)   SpO2: 95%     General: Awake, no distress.  Multiple nonproductive coughing fits CV:  Good peripheral perfusion.  RRR without appreciable murmur. Resp:  Normal effort.  CTAB Abd:  No distention.  MSK:  No deformity noted.  Tender to palpation to the lower aspect of the sternum without overlying skin changes or signs of trauma. Neuro:  No focal deficits appreciated. Other:     ED Results / Procedures / Treatments   Labs (all labs ordered are listed, but only abnormal results are displayed) Labs Reviewed  RESP PANEL BY RT-PCR (RSV, FLU A&B, COVID)  RVPGX2 - Abnormal;  Notable for the following components:      Result Value   Influenza A by PCR POSITIVE (*)    All other components within normal limits  CBC WITH DIFFERENTIAL/PLATELET - Abnormal; Notable for the following components:   Hemoglobin 12.1 (*)    HCT 36.6 (*)    All other components within normal limits  COMPREHENSIVE METABOLIC PANEL - Abnormal; Notable for the following components:   Sodium 133 (*)    Glucose, Bld 171 (*)    Calcium 8.8 (*)    All other components within normal limits  TROPONIN I (HIGH SENSITIVITY)    EKG Sinus rhythm at a rate of 97 bpm.  Normal axis and intervals.  No clear signs of acute ischemia.  RADIOLOGY CXR interpreted by me without evidence of acute cardiopulmonary pathology.  Official radiology report(s): DG Chest Portable 1 View  Result Date: 07/02/2022 CLINICAL DATA:  Dyspnea, cough EXAM: PORTABLE CHEST 1 VIEW COMPARISON:  None Available. FINDINGS: The heart size and mediastinal contours are within normal limits. Both lungs are clear. The visualized skeletal  structures are unremarkable. IMPRESSION: No active disease. Electronically Signed   By: Helyn Numbers M.D.   On: 07/02/2022 03:57    PROCEDURES and INTERVENTIONS:  Procedures  Medications  acetaminophen (TYLENOL) tablet 650 mg (650 mg Oral Given 07/02/22 0332)     IMPRESSION / MDM / ASSESSMENT AND PLAN / ED COURSE  I reviewed the triage vital signs and the nursing notes.  Differential diagnosis includes, but is not limited to, pneumonia, pneumothorax, viral syndrome, ACS  {Patient presents with symptoms of an acute illness or injury that is potentially life-threatening.  56 year old male presents to the ED with an acutely worsening cough, likely due to superimposed influenza on top of a more typical viral cold prior to this, and ultimately suitable for outpatient management.  Low-grade fevers noted.  He is having multiple coughing fits on exam, but can catch his breath and eventually speak in  full sentences.  He is tender at his area of chest pain I suspect MSK etiology of pain and doubt ACS or more severe cardiopulmonary derangements to cause this.  No pneumothorax or infiltrate on the CXR.  No leukocytosis to suggest bacterial etiology.  Negative troponin.  Metabolic panel is largely normal.  We will discuss symptomatic measures, supportive care and he is suitable for outpatient management.  Discussed return precautions.      FINAL CLINICAL IMPRESSION(S) / ED DIAGNOSES   Final diagnoses:  None     Rx / DC Orders   ED Discharge Orders     None        Note:  This document was prepared using Dragon voice recognition software and may include unintentional dictation errors.   Delton [place], MD 07/02/22 [phone]

## 2022-07-04 ENCOUNTER — Telehealth: Payer: Self-pay | Admitting: *Deleted

## 2022-07-04 NOTE — Patient Outreach (Signed)
  Care Coordination [facility] [place] Note Transition Care Management Unsuccessful Follow-up Telephone Call  Date of discharge and from where:  07/02/22 from [facility]  Attempts:  1st Attempt  Reason for unsuccessful TCM follow-up call:  No answer/busy   Estanislado Emms RN, BSN [place]  [facility] RN Care Coordinator

## 2022-07-18 DIAGNOSIS — H25811 Combined forms of age-related cataract, right eye: Secondary | ICD-10-CM | POA: Diagnosis not present

## 2022-07-24 DIAGNOSIS — E119 Type 2 diabetes mellitus without complications: Secondary | ICD-10-CM | POA: Diagnosis not present

## 2022-07-24 DIAGNOSIS — Z23 Encounter for immunization: Secondary | ICD-10-CM | POA: Diagnosis not present

## 2022-07-24 DIAGNOSIS — N529 Male erectile dysfunction, unspecified: Secondary | ICD-10-CM | POA: Diagnosis not present

## 2022-07-24 DIAGNOSIS — M5442 Lumbago with sciatica, left side: Secondary | ICD-10-CM | POA: Diagnosis not present

## 2022-07-24 DIAGNOSIS — G8929 Other chronic pain: Secondary | ICD-10-CM | POA: Diagnosis not present

## 2022-07-24 DIAGNOSIS — M48062 Spinal stenosis, lumbar region with neurogenic claudication: Secondary | ICD-10-CM | POA: Diagnosis not present

## 2022-07-24 DIAGNOSIS — G894 Chronic pain syndrome: Secondary | ICD-10-CM | POA: Diagnosis not present

## 2022-07-24 DIAGNOSIS — I10 Essential (primary) hypertension: Secondary | ICD-10-CM | POA: Diagnosis not present

## 2022-07-24 DIAGNOSIS — M5416 Radiculopathy, lumbar region: Secondary | ICD-10-CM | POA: Diagnosis not present

## 2022-11-22 IMAGING — CR DG LUMBAR SPINE 2-3V
3 series · 3 of 3 positions shown · non-contrast
Comparison: 12/13/2021

CLINICAL DATA: Left-sided back pain

EXAM:
LUMBAR SPINE - 2-3 VIEW

[l-spine ap]
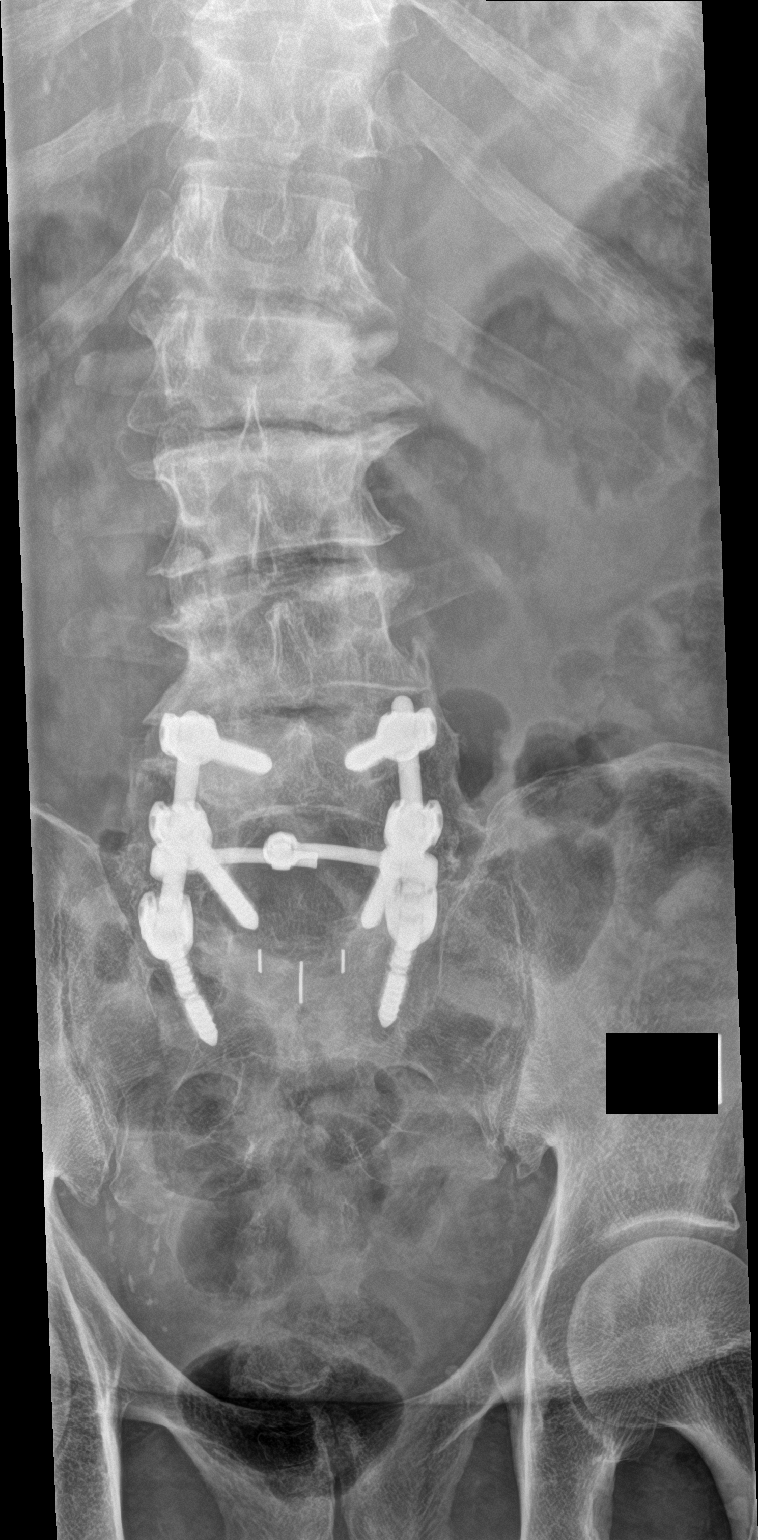

[l-spine lat]
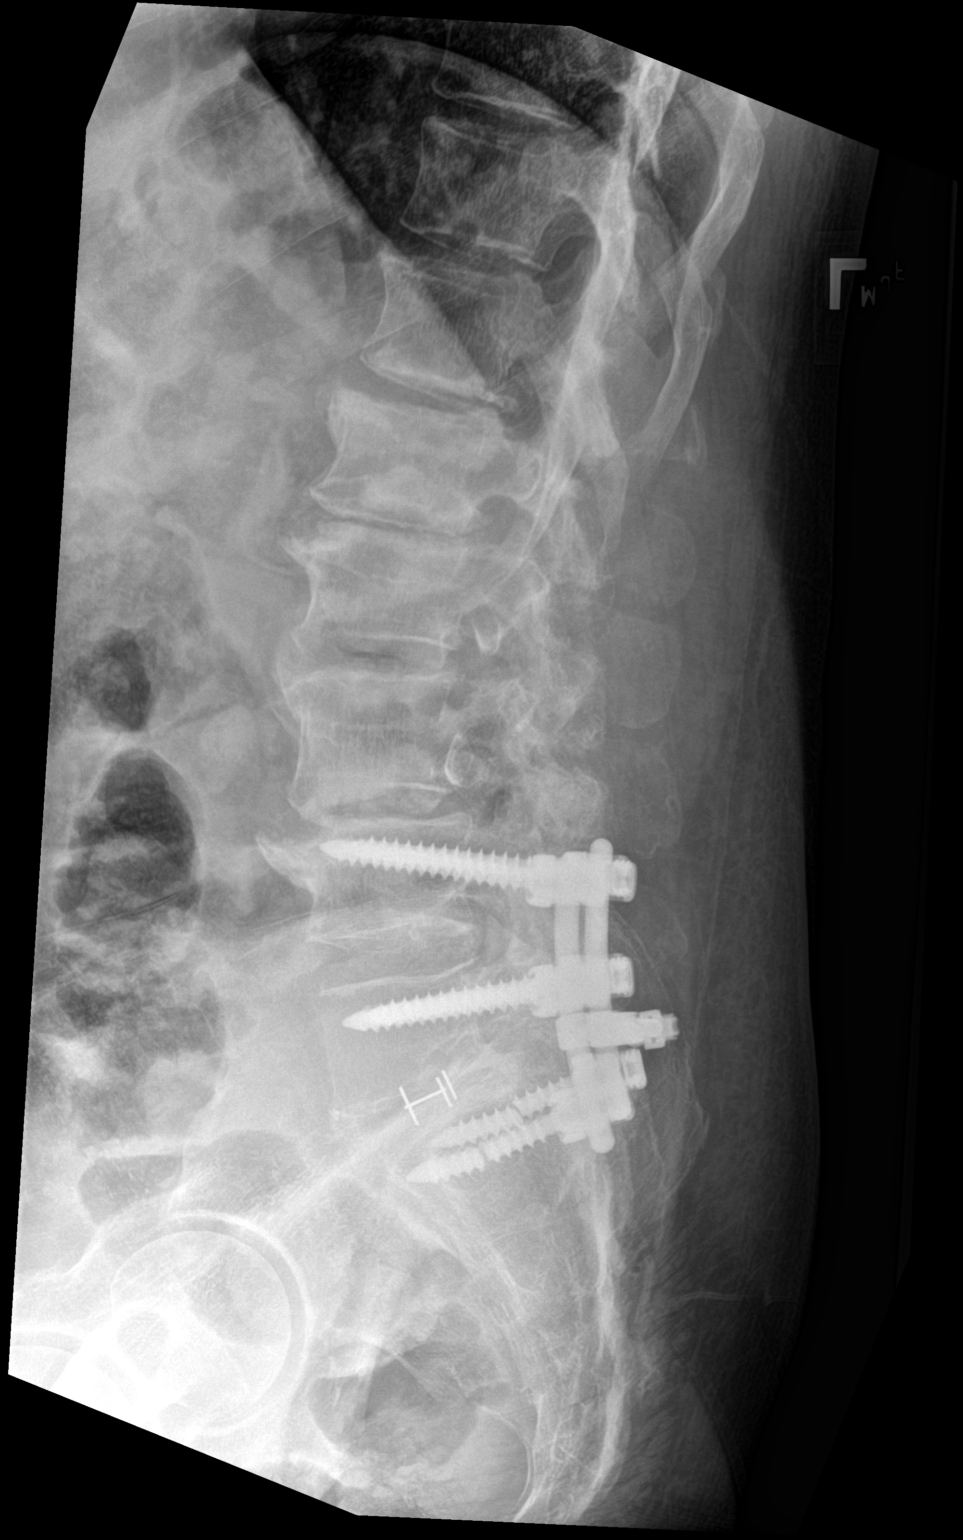

[l-spine spot]
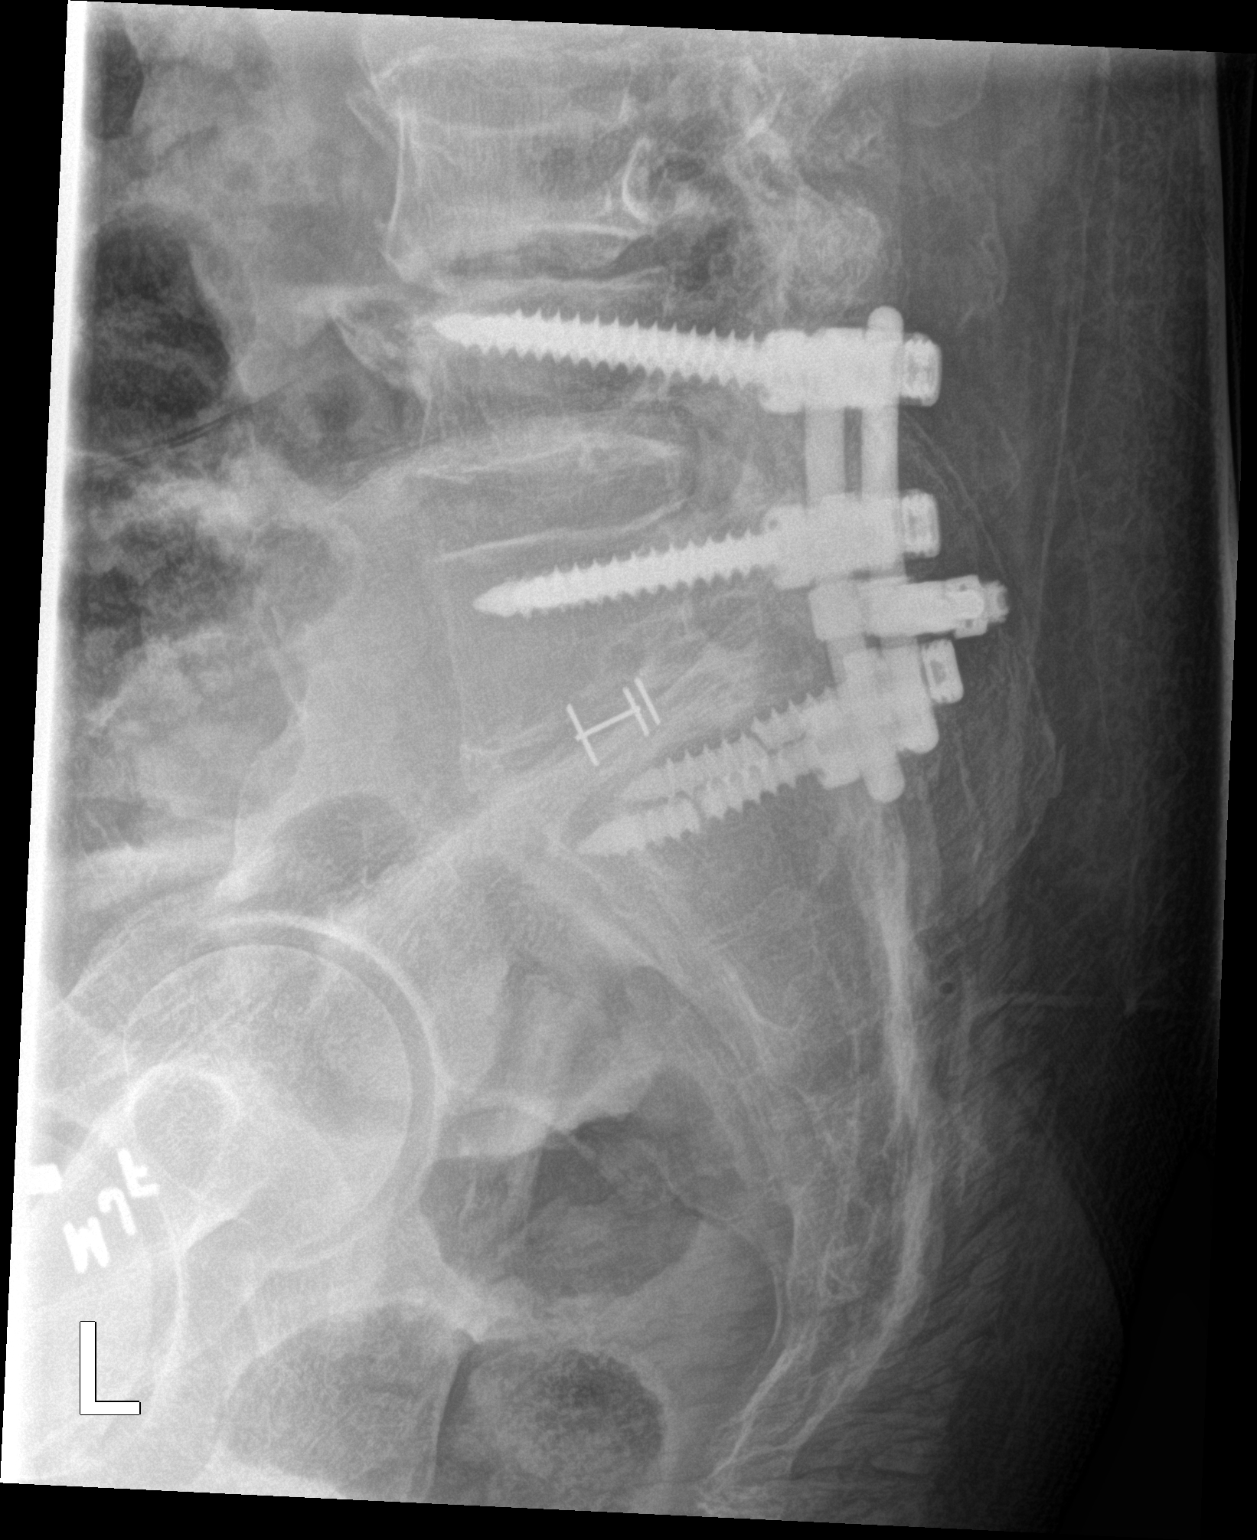

[3 of 3 positions shown; findings below may reference images not displayed]

FINDINGS: Scoliosis of the lumbar spine. Posterior spinal hardware L4 through
S1 with interbody device at L5-S1. Both screws at S1 are fractured
as before. Grade 1 anterolisthesis L3 on L4. Multilevel degenerative
changes with severe disc space narrowing and degenerative change
L1-L2.
IMPRESSION: 1. No acute osseous abnormality.
2. Stable appearance of lumbosacral spinal hardware with chronic
screw fracture at S1 bilaterally
3. Multilevel degenerative changes

## 2023-10-14 ENCOUNTER — Other Ambulatory Visit: Payer: Self-pay

## 2023-10-14 ENCOUNTER — Encounter: Payer: Self-pay | Admitting: Internal Medicine

## 2023-10-14 ENCOUNTER — Observation Stay

## 2023-10-14 ENCOUNTER — Inpatient Hospital Stay
Admission: EM | Admit: 2023-10-14 | Discharge: 2023-10-17 | DRG: 064 | Disposition: A | Discharge status: Home / Self Care | Attending: Obstetrics and Gynecology | Admitting: Obstetrics and Gynecology

## 2023-10-14 ENCOUNTER — Emergency Department

## 2023-10-14 DIAGNOSIS — E119 Type 2 diabetes mellitus without complications: Secondary | ICD-10-CM

## 2023-10-14 DIAGNOSIS — Z6830 Body mass index (BMI) 30.0-30.9, adult: Secondary | ICD-10-CM

## 2023-10-14 DIAGNOSIS — I10 Essential (primary) hypertension: Secondary | ICD-10-CM | POA: Diagnosis present

## 2023-10-14 DIAGNOSIS — R270 Ataxia, unspecified: Secondary | ICD-10-CM

## 2023-10-14 DIAGNOSIS — Z7984 Long term (current) use of oral hypoglycemic drugs: Secondary | ICD-10-CM

## 2023-10-14 DIAGNOSIS — F17210 Nicotine dependence, cigarettes, uncomplicated: Secondary | ICD-10-CM | POA: Diagnosis present

## 2023-10-14 DIAGNOSIS — M4802 Spinal stenosis, cervical region: Secondary | ICD-10-CM | POA: Insufficient documentation

## 2023-10-14 DIAGNOSIS — E785 Hyperlipidemia, unspecified: Secondary | ICD-10-CM | POA: Diagnosis present

## 2023-10-14 DIAGNOSIS — I619 Nontraumatic intracerebral hemorrhage, unspecified: Principal | ICD-10-CM | POA: Diagnosis present

## 2023-10-14 DIAGNOSIS — F32A Depression, unspecified: Secondary | ICD-10-CM | POA: Diagnosis present

## 2023-10-14 DIAGNOSIS — G959 Disease of spinal cord, unspecified: Secondary | ICD-10-CM

## 2023-10-14 DIAGNOSIS — Z7982 Long term (current) use of aspirin: Secondary | ICD-10-CM

## 2023-10-14 DIAGNOSIS — E66811 Obesity, class 1: Secondary | ICD-10-CM | POA: Insufficient documentation

## 2023-10-14 DIAGNOSIS — G894 Chronic pain syndrome: Secondary | ICD-10-CM | POA: Diagnosis not present

## 2023-10-14 DIAGNOSIS — E1165 Type 2 diabetes mellitus with hyperglycemia: Secondary | ICD-10-CM | POA: Diagnosis present

## 2023-10-14 DIAGNOSIS — G8191 Hemiplegia, unspecified affecting right dominant side: Secondary | ICD-10-CM | POA: Diagnosis present

## 2023-10-14 DIAGNOSIS — Z79899 Other long term (current) drug therapy: Secondary | ICD-10-CM

## 2023-10-14 DIAGNOSIS — Z7902 Long term (current) use of antithrombotics/antiplatelets: Secondary | ICD-10-CM

## 2023-10-14 DIAGNOSIS — I358 Other nonrheumatic aortic valve disorders: Secondary | ICD-10-CM | POA: Diagnosis present

## 2023-10-14 DIAGNOSIS — I639 Cerebral infarction, unspecified: Principal | ICD-10-CM | POA: Diagnosis present

## 2023-10-14 DIAGNOSIS — R4701 Aphasia: Secondary | ICD-10-CM | POA: Diagnosis present

## 2023-10-14 DIAGNOSIS — R29703 NIHSS score 3: Secondary | ICD-10-CM | POA: Diagnosis present

## 2023-10-14 DIAGNOSIS — I7774 Dissection of vertebral artery: Secondary | ICD-10-CM | POA: Diagnosis present

## 2023-10-14 DIAGNOSIS — G459 Transient cerebral ischemic attack, unspecified: Secondary | ICD-10-CM

## 2023-10-14 LAB — CBC
HCT: 42.9 % (ref 39.0–52.0)
Hemoglobin: 14.4 g/dL (ref 13.0–17.0)
MCH: 28 pg (ref 26.0–34.0)
MCHC: 33.6 g/dL (ref 30.0–36.0)
MCV: 83.3 fL (ref 80.0–100.0)
Platelets: 288 10*3/uL (ref 150–400)
RBC: 5.15 MIL/uL (ref 4.22–5.81)
RDW: 13 % (ref 11.5–15.5)
WBC: 4.4 10*3/uL (ref 4.0–10.5)
nRBC: 0 % (ref 0.0–0.2)

## 2023-10-14 LAB — COMPREHENSIVE METABOLIC PANEL WITH GFR
ALT: 19 U/L (ref 0–44)
AST: 23 U/L (ref 15–41)
Albumin: 4.2 g/dL (ref 3.5–5.0)
Alkaline Phosphatase: 87 U/L (ref 38–126)
Anion gap: 10 (ref 5–15)
BUN: 8 mg/dL (ref 6–20)
CO2: 22 mmol/L (ref 22–32)
Calcium: 9.6 mg/dL (ref 8.9–10.3)
Chloride: 105 mmol/L (ref 98–111)
Creatinine, Ser: 0.77 mg/dL (ref 0.61–1.24)
GFR, Estimated: >60 mL/min (ref >60)
Glucose, Bld: 246 mg/dL — ABNORMAL HIGH (ref 70–99)
Potassium: 3.9 mmol/L (ref 3.5–5.1)
Sodium: 137 mmol/L (ref 135–145)
Total Bilirubin: 0.9 mg/dL (ref 0.0–1.2)
Total Protein: 7.2 g/dL (ref 6.5–8.1)

## 2023-10-14 LAB — CBG MONITORING, ED
Glucose-Capillary: 250 mg/dL — ABNORMAL HIGH (ref 70–99)
Glucose-Capillary: 147 mg/dL — ABNORMAL HIGH (ref 70–99)

## 2023-10-14 LAB — APTT: aPTT: 27 s (ref 24–36)

## 2023-10-14 LAB — DIFFERENTIAL
Abs Immature Granulocytes: 0.02 10*3/uL (ref 0.00–0.07)
Basophils Absolute: 0 10*3/uL (ref 0.0–0.1)
Basophils Relative: 1 %
Eosinophils Absolute: 0.3 10*3/uL (ref 0.0–0.5)
Eosinophils Relative: 7 %
Immature Granulocytes: 1 %
Lymphocytes Relative: 29 %
Lymphs Abs: 1.3 10*3/uL (ref 0.7–4.0)
Monocytes Absolute: 0.4 10*3/uL (ref 0.1–1.0)
Monocytes Relative: 9 %
Neutro Abs: 2.4 10*3/uL (ref 1.7–7.7)
Neutrophils Relative %: 53 %

## 2023-10-14 LAB — PROTIME-INR
INR: 0.9 (ref 0.8–1.2)
Prothrombin Time: 12.7 s (ref 11.4–15.2)

## 2023-10-14 LAB — ETHANOL: Alcohol, Ethyl (B): <10 mg/dL (ref <10)

## 2023-10-14 LAB — GLUCOSE, CAPILLARY: Glucose-Capillary: 152 mg/dL — ABNORMAL HIGH (ref 70–99)

## 2023-10-14 MED ORDER — SENNOSIDES-DOCUSATE SODIUM 8.6-50 MG PO TABS: 1.0000 | ORAL_TABLET | Freq: Every evening | ORAL | Status: DC | PRN

## 2023-10-14 MED ORDER — ONDANSETRON HCL 4 MG/2ML IJ SOLN: 4.0000 mg | Freq: Four times a day (QID) | INTRAMUSCULAR | Status: DC | PRN

## 2023-10-14 MED ORDER — ASPIRIN 81 MG PO TBEC
81.0000 mg | DELAYED_RELEASE_TABLET | Freq: Every day | ORAL | Status: DC
  Administered 2023-10-15 – 2023-10-16 (×2): 81 mg via ORAL
  Filled 2023-10-14 (×2): qty 1

## 2023-10-14 MED ORDER — ACETAMINOPHEN 325 MG PO TABS: 650.0000 mg | ORAL_TABLET | ORAL | Status: DC | PRN

## 2023-10-14 MED ORDER — HYDROMORPHONE HCL 1 MG/ML IJ SOLN
0.5000 mg | INTRAMUSCULAR | Status: DC | PRN
  Administered 2023-10-14: 0.5 mg via INTRAVENOUS
  Filled 2023-10-14: qty 0.5

## 2023-10-14 MED ORDER — HYDROMORPHONE HCL 1 MG/ML IJ SOLN
INTRAMUSCULAR | Status: AC
  Administered 2023-10-14: 1 mg via INTRAVENOUS
  Filled 2023-10-14: qty 1

## 2023-10-14 MED ORDER — ACETAMINOPHEN 650 MG RE SUPP: 650.0000 mg | RECTAL | Status: DC | PRN

## 2023-10-14 MED ORDER — HYDROCODONE-ACETAMINOPHEN 10-325 MG PO TABS
1.0000 | ORAL_TABLET | Freq: Three times a day (TID) | ORAL | Status: DC | PRN
  Administered 2023-10-14 – 2023-10-15 (×2): 1 via ORAL
  Filled 2023-10-14 (×2): qty 1

## 2023-10-14 MED ORDER — HYDROMORPHONE HCL 1 MG/ML IJ SOLN
0.5000 mg | INTRAMUSCULAR | Status: DC | PRN
  Administered 2023-10-14 – 2023-10-15 (×3): 0.5 mg via INTRAVENOUS
  Filled 2023-10-14: qty 0.5
  Filled 2023-10-14: qty 1
  Filled 2023-10-14: qty 0.5

## 2023-10-14 MED ORDER — ATORVASTATIN CALCIUM 20 MG PO TABS
80.0000 mg | ORAL_TABLET | Freq: Every day | ORAL | Status: DC
Start: 2023-10-14 — End: 2023-10-17
  Administered 2023-10-15 – 2023-10-16 (×3): 80 mg via ORAL
  Filled 2023-10-14 (×4): qty 4

## 2023-10-14 MED ORDER — IOHEXOL 350 MG/ML SOLN
75.0000 mL | Freq: Once | INTRAVENOUS | Status: AC | PRN
  Administered 2023-10-14: 75 mL via INTRAVENOUS

## 2023-10-14 MED ORDER — HYDROMORPHONE HCL 1 MG/ML IJ SOLN: 1.0000 mg | INTRAMUSCULAR | Status: DC | PRN

## 2023-10-14 MED ORDER — ACETAMINOPHEN 160 MG/5ML PO SOLN: 650.0000 mg | ORAL | Status: DC | PRN

## 2023-10-14 MED ORDER — INSULIN ASPART 100 UNIT/ML IJ SOLN
0.0000 [IU] | Freq: Three times a day (TID) | INTRAMUSCULAR | Status: DC
  Administered 2023-10-14: 2 [IU] via SUBCUTANEOUS
  Administered 2023-10-15: 5 [IU] via SUBCUTANEOUS
  Administered 2023-10-15: 8 [IU] via SUBCUTANEOUS
  Administered 2023-10-15: 3 [IU] via SUBCUTANEOUS
  Administered 2023-10-16: 5 [IU] via SUBCUTANEOUS
  Administered 2023-10-16: 11 [IU] via SUBCUTANEOUS
  Administered 2023-10-16: 8 [IU] via SUBCUTANEOUS
  Administered 2023-10-17: 5 [IU] via SUBCUTANEOUS
  Filled 2023-10-14 (×8): qty 1

## 2023-10-14 MED ORDER — GADOBUTROL 1 MMOL/ML IV SOLN
9.0000 mL | Freq: Once | INTRAVENOUS | Status: AC | PRN
  Administered 2023-10-14: 9 mL via INTRAVENOUS

## 2023-10-14 MED ORDER — LISINOPRIL 20 MG PO TABS
40.0000 mg | ORAL_TABLET | Freq: Every day | ORAL | Status: DC
  Filled 2023-10-14: qty 2

## 2023-10-14 MED ORDER — STROKE: EARLY STAGES OF RECOVERY BOOK: Freq: Once | Status: AC

## 2023-10-14 MED ORDER — ENOXAPARIN SODIUM 60 MG/0.6ML IJ SOSY
0.5000 mg/kg | PREFILLED_SYRINGE | INTRAMUSCULAR | Status: DC
  Administered 2023-10-14 – 2023-10-16 (×3): 47.5 mg via SUBCUTANEOUS
  Filled 2023-10-14 (×3): qty 0.6

## 2023-10-14 MED ORDER — ASPIRIN 81 MG PO CHEW: 324.0000 mg | CHEWABLE_TABLET | Freq: Once | ORAL | Status: DC

## 2023-10-14 NOTE — Assessment & Plan Note (Signed)
 -  Hold home antihypertensives to allow for permissive hypertension

## 2023-10-14 NOTE — ED Provider Notes (Signed)
 [facility] Provider Note    Event Date/Time   First MD Initiated Contact with Patient 10/14/23 1127     (approximate)   History   Cerebrovascular Accident Pt here with stroke like symptoms, LKW was 2230 last night. Pt has slurred speech, gait issues, and erratic movements. Wife states pt is not behaving normally.    HPI  Wesley Bradford is a 58 y.o. male pmh diabetes, hypertension presents for evaluation of speech difficulty -Last seen normal yesterday evening at 11:30 PM (about 12 hours ago) -Was found this morning to have aphasia, reported ataxia, dysarthria -Denies any focal weakness, no facial droop appreciated.  Has been drooling today which is new for him. -Accompanied by family who provides collateral     Physical Exam   Triage Vital Signs: ED Triage Vitals [10/14/23 1016]  Encounter Vitals Group     BP (!) 151/85     Systolic BP Percentile      Diastolic BP Percentile      Pulse Rate 88     Resp 17     Temp 98.5 F (36.9 C)     Temp Source Oral     SpO2 97 %     Weight 210 lb 1.6 oz (95.3 kg)     Height 5\' 10"  (1.778 m)     Head Circumference      Peak Flow      Pain Score 0     Pain Loc      Pain Education      Exclude from Growth Chart     Most recent vital signs: Vitals:   10/14/23 1016 10/14/23 1400  BP: (!) 151/85 (!) 165/86  Pulse: 88   Resp: 17   Temp: 98.5 F (36.9 C) 98.2 F (36.8 C)  SpO2: 97%      General: Awake, no distress.  CV:  Good peripheral perfusion.  Regular rate and rhythm, RP 2+, well-perfused. Resp:  Normal effort.  CTAB. Abd:  No distention.  Nontender throughout. Neuro:  Aox4, CN II-XII intact, FNF with ataxia bilaterally, grip 5/5 BUE. BUE AG 10+ sec no drift, BLE AG 5+ sec no drift.  Gait testing deferred.  NIH stroke scale eval below.  Moderate aphasia and mild dysarthria appreciated.  NIH Stroke Scale 1a. Level of Consciousness: 0 1b. LOC Questions- The patient is asked the month and  his/her age: 61 1c. LOC Commands- The patient is asked to open and close the eyes and then grip and release the non-paretic hand:0 2. Best Gaze: 0 3. Visual: 0 4. Facial Palsy: 0 5A. Motor Left Arm: 0 5B. Motor Right Arm: 0 6A. Motor Left Leg: 0 6B. Motor Right Leg: 0 7. Limb Ataxia: 2 8. Sensory: 0 9. Best Language: 1 (moderate) 10. Dysarthia: 1 (mild) 11. Extinction and Inattention (formerly Neglect): 0  Total score: 4    ED Results / Procedures / Treatments   Labs (all labs ordered are listed, but only abnormal results are displayed) Labs Reviewed  COMPREHENSIVE METABOLIC PANEL WITH GFR - Abnormal; Notable for the following components:      Result Value   Glucose, Bld 246 (*)    All other components within normal limits  CBG MONITORING, ED - Abnormal; Notable for the following components:   Glucose-Capillary 250 (*)    All other components within normal limits  PROTIME-INR  APTT  CBC  DIFFERENTIAL  ETHANOL  HIV ANTIBODY (ROUTINE TESTING W REFLEX)  CBG MONITORING, ED  I-STAT CREATININE, ED     EKG  Ecg = sinus rhythm, rate 71, no ST elevation or depression, no significant repolarization abnormality, normal axis, normal intervals.   RADIOLOGY CT head interpreted by myself, unremarkable.  Formal radiology read read reviewed.   PROCEDURES:  Critical Care performed: No  Procedures   MEDICATIONS ORDERED IN ED: Medications  aspirin chewable tablet 324 mg (324 mg Oral Not Given 10/14/23 1242)   stroke: early stages of recovery book (has no administration in time range)  acetaminophen (TYLENOL) tablet 650 mg (has no administration in time range)    Or  acetaminophen (TYLENOL) 160 MG/5ML solution 650 mg (has no administration in time range)    Or  acetaminophen (TYLENOL) suppository 650 mg (has no administration in time range)  senna-docusate (Senokot-S) tablet 1 tablet (has no administration in time range)  enoxaparin (LOVENOX) injection 47.5 mg (47.5 mg  Subcutaneous Given 10/14/23 1454)  HYDROmorphone (DILAUDID) injection 0.5 mg (0.5 mg Intravenous Given 10/14/23 1501)  insulin aspart (novoLOG) injection 0-15 Units (has no administration in time range)  iohexol (OMNIPAQUE) 350 MG/ML injection 75 mL (75 mLs Intravenous Contrast Given 10/14/23 1509)     IMPRESSION / MDM / ASSESSMENT AND PLAN / ED COURSE  I reviewed the triage vital signs and the nursing notes.                              Differential diagnosis includes, but is not limited to, CVA.  Glucose tested on arrival, normal.  Consider underlying metabolic abnormality.  Doubt secondary to any substance use.  Outside of acute stroke window and no evidence suggestive of large vessel occlusion at this time.  Received broad laboratory testing on arrival, CT head with no bleed and no obvious infarct.  High clinical concern for CVA, will admit for stroke workup, MRI ordered.  Will give aspirin.  Patient's presentation is most consistent with acute presentation with potential threat to life or bodily function. The patient is on the cardiac monitor to evaluate for evidence of arrhythmia and/or significant heart rate changes.  Admitted to medicine service for stroke workup and treatment. Clinical Course as of 10/14/23 1544  Mon Oct 14, 2023  1159 NIHSS 4 for ataxia and at least 2 limbs (abnormal finger-nose-finger bilaterally), moderate aphasia, mild dysarthria  Last known normal 11:30 PM  No clear evidence of large vessel occlusion  Hospitalist consult order placed, aspirin ordered [MM]  1255 Case discussed with hospitalist for admission [MM]    Clinical Course User Index [MM] Marinell Blight, MD     FINAL CLINICAL IMPRESSION(S) / ED DIAGNOSES   Final diagnoses:  Cerebrovascular accident (CVA), unspecified mechanism (HCC)  Aphasia  Ataxia     Rx / DC Orders   ED Discharge Orders     None        Note:  This document was prepared using Dragon voice recognition software and  may include unintentional dictation errors.   Marinell Blight, MD 10/14/23 [phone]

## 2023-10-14 NOTE — ED Notes (Signed)
 Pt going to MRI

## 2023-10-14 NOTE — Assessment & Plan Note (Addendum)
 Patient is presenting with concern for expressive aphasia and dysarthria. At this time, patient is struggling to find his words but also right sided weakness with abnormal FTN. Weakness may be due to known spinal issues. Last known normal is yesterday at 22:30.  Primary suspicion at this time is acute CVA given risk factors including hypertension, hyperlipidemia, uncontrolled diabetes.  No risk factors for embolic etiology.  - Neurology consulted; appreciate their recommendations - MRI brain pending - CTA head/neck pending - Telemetry monitoring - Allow for permissive HTN (systolic < 220 and diastolic < 120)  - Echocardiogram  - A1C and Lipid panel  - Start aspirin and statin when able to pass bedside swallow - PT/OT/SLP

## 2023-10-14 NOTE — Assessment & Plan Note (Signed)
 Per chart review, patient has a history of chronic pain syndrome on chronic Norco.  PDMP reviewed and appropriate.  - Restart home Norco when able to pass bedside swallow - Low-dose Dilaudid for pain control until then

## 2023-10-14 NOTE — Assessment & Plan Note (Signed)
 History of uncontrolled type 2 diabetes with last A1c of 9.5% approximately 2 months ago.  - Hold home regimen - SSI, moderate

## 2023-10-14 NOTE — ED Notes (Signed)
 EDP made aware pt failed swallow screen

## 2023-10-14 NOTE — ED Notes (Signed)
 Patient transported to MRI

## 2023-10-14 NOTE — ED Triage Notes (Signed)
 Pt here with stroke like symptoms, LKW was 2230 last night. Pt has slurred speech, gait issues, and erratic movements. Wife states pt is not behaving normally.

## 2023-10-14 NOTE — ED Notes (Signed)
 Pt being screened on phone by MRI with wife

## 2023-10-14 NOTE — H&P (Signed)
 History and Physical    Patient: Wesley Bradford HYQ:[id number] DOB: 02-Jan-1966 DOA: 10/14/2023 DOS: the patient was seen and examined on 10/14/2023 PCP: Wesley Knee, MD  Patient coming from: Home  Chief Complaint:  Chief Complaint  Patient presents with   Cerebrovascular Accident   HPI: Wesley Bradford is a 58 y.o. male with medical history significant of type 2 diabetes, hypertension, hyperlipidemia, chronic pain syndrome, recurrent pancreatitis, spinal stenosis, who presents to the ED due to dysarthria.  Wesley Bradford states that last night at approximately 2230, his wife noticed that his speech seemed "off" and he was having difficulty finding his words.  He ended up going to bed but this morning, his wife told him that his speech seems worse.  They called Wesley Bradford who was concerned for significant slurring of the words and called 911.  Wesley Bradford denies noting any focal weakness, dizziness, vision changes, chest pain, shortness of breath, palpitations.  He denies any recent illness including congestion, cough, runny nose, nausea, vomiting, diarrhea.  He denies any prior history of intracranial abnormality.  At this time, Wesley Bradford states his speech seems nearly back to normal but he still struggling to find his words.  ED course: On arrival to the ED, patient was hypertensive at 151/85 with heart rate of 88.  He was saturating at 97% on room air.  He was afebrile 98.5. Initial workup notable for unremarkable CBC and CMP with exception of glucose of 246.  CT head with no acute abnormalities.  MRI brain pending.  TRH consulted for admission.  Review of Systems: As mentioned in the history of present illness. All other systems reviewed and are negative.  Past Medical History:  Diagnosis Date   Diabetes mellitus without complication (HCC)    Hypertension    No past surgical history on file.  Social History:  reports that he has been smoking cigarettes. He  has never used smokeless tobacco. He reports current alcohol use. He reports that he does not use drugs.  No Known Allergies  No family history on file.  Prior to Admission medications   Medication Sig Start Date End Date Taking? Authorizing Provider  JANUVIA 100 MG tablet Take 100 mg by mouth daily. 12/08/20   [provider]  lisinopril (ZESTRIL) 40 MG tablet Take 40 mg by mouth daily. 12/06/20   [provider]  metFORMIN (GLUCOPHAGE) 1000 MG tablet Take 1,000 mg by mouth 2 (two) times daily. 12/06/20   [provider]  predniSONE (STERAPRED UNI-PAK 21 TAB) 10 MG (21) TBPK tablet Take 6 tablets on the first day and decrease by 1 tablet each day until finished. 12/13/21   Kem Boroughs B, FNP   Physical Exam: Vitals:   10/14/23 1016 10/14/23 1400  BP: (!) 151/85 (!) 165/86  Pulse: 88   Resp: 17   Temp: 98.5 F (36.9 C) 98.2 F (36.8 C)  TempSrc: Oral   SpO2: 97%   Weight: 95.3 kg   Height: 5\' 10"  (1.778 m)    Physical Exam Vitals and nursing note reviewed.  Constitutional:      General: He is not in acute distress.    Appearance: He is normal weight.  HENT:     Head: Normocephalic and atraumatic.     Mouth/Throat:     Mouth: Mucous membranes are moist.     Pharynx: Oropharynx is clear.  Eyes:     Conjunctiva/sclera: Conjunctivae normal.     Pupils: Pupils are equal,  round, and reactive to light.  Cardiovascular:     Rate and Rhythm: Normal rate and regular rhythm.     Heart sounds: No murmur heard.    No gallop.  Pulmonary:     Effort: Pulmonary effort is normal. No respiratory distress.     Breath sounds: Normal breath sounds. No wheezing, rhonchi or rales.  Abdominal:     General: Bowel sounds are normal. There is no distension.     Palpations: Abdomen is soft.  Musculoskeletal:     Right lower leg: No edema.     Left lower leg: No edema.  Skin:    General: Skin is warm and dry.  Neurological:     Mental Status: He is alert.      Comments:  No facial asymmetry or dysarthria. Patient is having difficulty findings his words and stutters occasionally.  4/5 strength of right upper extremity, 5/5 left upper extremity 2/5 strength of right lower extremity, 5/5 left lower extremity Sensation grossly intact throughout Abnormal finger to nose on the left.  No tremor  Psychiatric:        Mood and Affect: Mood normal.        Behavior: Behavior normal.    Data Reviewed: CBC with WBC of 4.4, hemoglobin of 14.4, platelets of 288 CMP with sodium of 137, potassium 3.9, bicarb 22, BUN 8, creatinine 0.77, AST 23, ALT 19, GFR above 60 Alcohol level negative INR 0.9 PTT 27  EKG personally reviewed.  Sinus rhythm with a rate of 71.  No ST or T wave changes concerning for acute ischemia.  MR BRAIN WO CONTRAST Result Date: 10/14/2023 CLINICAL DATA:  Stroke-like symptoms. Slurred speech, gait abnormality, and abnormal movements. EXAM: MRI HEAD WITHOUT CONTRAST TECHNIQUE: Multiplanar, multiecho pulse sequences of the brain and surrounding structures were obtained without intravenous contrast. COMPARISON:  Head CT 10/14/2023 FINDINGS: Brain: There is no evidence of an acute infarct, intracranial hemorrhage, mass, midline shift, or extra-axial fluid collection. Cerebral volume is normal. The ventricles are normal in size. Scattered small T2 hyperintensities in the cerebral white matter bilaterally are nonspecific but compatible with minimal chronic small vessel ischemic disease. Vascular: Major intracranial vascular flow voids are preserved. Skull and upper cervical spine: Unremarkable bone marrow signal. Sinuses/Orbits: Bilateral cataract extraction. Mild posterior right ethmoid air cell opacification as noted on CT. Clear mastoid air cells. Other: None. IMPRESSION: 1. No acute intracranial abnormality. 2. Minimal chronic small vessel ischemic disease. Electronically Signed   By: Sebastian Ache M.D.   On: 10/14/2023 13:36   CT HEAD WO  CONTRAST Result Date: 10/14/2023 CLINICAL DATA:  Neuro deficit, acute, stroke suspected. Slurred speech, gait abnormality, and abnormal movements. EXAM: CT HEAD WITHOUT CONTRAST TECHNIQUE: Contiguous axial images were obtained from the base of the skull through the vertex without intravenous contrast. RADIATION DOSE REDUCTION: This exam was performed according to the departmental dose-optimization program which includes automated exposure control, adjustment of the mA and/or kV according to patient size and/or use of iterative reconstruction technique. COMPARISON:  None Available. FINDINGS: Brain: There is no evidence of an acute infarct, intracranial hemorrhage, mass, midline shift, or extra-axial fluid collection. Cerebral volume is normal. The ventricles are normal in size. Vascular: No hyperdense vessel or unexpected calcification. Skull: No acute fracture or suspicious lesion. Sinuses/Orbits: Chronic appearing posterior right ethmoid air cell opacification. Clear mastoid air cells. Bilateral cataract extraction. Other: None. IMPRESSION: No evidence of acute intracranial abnormality. Electronically Signed   By: Sebastian Ache M.D.   On:  10/14/2023 10:50   Results are pending, will review when available.  Assessment and Plan:  * Acute CVA (cerebrovascular accident) [facility]) Patient is presenting with concern for expressive aphasia and dysarthria. At this time, patient is struggling to find his words but also right sided weakness with abnormal FTN. Weakness may be due to known spinal issues. Last known normal is yesterday at 22:30.  Primary suspicion at this time is acute CVA given risk factors including hypertension, hyperlipidemia, uncontrolled diabetes.  No risk factors for embolic etiology.  - Neurology consulted; appreciate their recommendations - MRI brain pending - CTA head/neck pending - Telemetry monitoring - Allow for permissive HTN (systolic < 220 and diastolic < 120)  - Echocardiogram  - A1C  and Lipid panel  - Start aspirin and statin when able to pass bedside swallow - PT/OT/SLP  Hypertension, essential - Hold home antihypertensives to allow for permissive hypertension  Chronic pain syndrome Per chart review, patient has a history of chronic pain syndrome on chronic Norco.  PDMP reviewed and appropriate.  - Restart home Norco when able to pass bedside swallow - Low-dose Dilaudid for pain control until then  Type II diabetes mellitus (HCC) History of uncontrolled type 2 diabetes with last A1c of 9.5% approximately 2 months ago.  - Hold home regimen - SSI, moderate  Advance Care Planning:   Code Status: Full Code verified by patient  Consults: Neurology  Family Communication: Patient's Bradford updated at bedside  Severity of Illness: The appropriate patient status for this patient is OBSERVATION. Observation status is judged to be reasonable and necessary in order to provide the required intensity of service to ensure the patient's safety. The patient's presenting symptoms, physical exam findings, and initial radiographic and laboratory data in the context of their medical condition is felt to place them at decreased risk for further clinical deterioration. Furthermore, it is anticipated that the patient will be medically stable for discharge from the hospital within 2 midnights of admission.   Author: Verdene Lennert, MD 10/14/2023 2:31 PM  For on call review [email or web address].

## 2023-10-14 NOTE — ED Notes (Signed)
 Patient transported to CT

## 2023-10-14 NOTE — ED Notes (Signed)
 Pt transported to MRI

## 2023-10-15 ENCOUNTER — Observation Stay (HOSPITAL_COMMUNITY)
Admit: 2023-10-15 | Discharge: 2023-10-15 | Disposition: A | Discharge status: Home / Self Care | Attending: Internal Medicine | Admitting: Internal Medicine

## 2023-10-15 DIAGNOSIS — E1165 Type 2 diabetes mellitus with hyperglycemia: Secondary | ICD-10-CM | POA: Diagnosis present

## 2023-10-15 DIAGNOSIS — G959 Disease of spinal cord, unspecified: Secondary | ICD-10-CM | POA: Diagnosis not present

## 2023-10-15 DIAGNOSIS — I6522 Occlusion and stenosis of left carotid artery: Secondary | ICD-10-CM | POA: Diagnosis not present

## 2023-10-15 DIAGNOSIS — E66811 Obesity, class 1: Secondary | ICD-10-CM | POA: Insufficient documentation

## 2023-10-15 DIAGNOSIS — I10 Essential (primary) hypertension: Secondary | ICD-10-CM | POA: Diagnosis present

## 2023-10-15 DIAGNOSIS — I6389 Other cerebral infarction: Secondary | ICD-10-CM

## 2023-10-15 DIAGNOSIS — F32A Depression, unspecified: Secondary | ICD-10-CM | POA: Diagnosis present

## 2023-10-15 DIAGNOSIS — M4802 Spinal stenosis, cervical region: Secondary | ICD-10-CM | POA: Diagnosis present

## 2023-10-15 DIAGNOSIS — G9520 Unspecified cord compression: Secondary | ICD-10-CM

## 2023-10-15 DIAGNOSIS — Z7902 Long term (current) use of antithrombotics/antiplatelets: Secondary | ICD-10-CM | POA: Diagnosis not present

## 2023-10-15 DIAGNOSIS — I619 Nontraumatic intracerebral hemorrhage, unspecified: Secondary | ICD-10-CM | POA: Diagnosis present

## 2023-10-15 DIAGNOSIS — R4701 Aphasia: Secondary | ICD-10-CM | POA: Diagnosis present

## 2023-10-15 DIAGNOSIS — Z6830 Body mass index (BMI) 30.0-30.9, adult: Secondary | ICD-10-CM | POA: Diagnosis not present

## 2023-10-15 DIAGNOSIS — I358 Other nonrheumatic aortic valve disorders: Secondary | ICD-10-CM | POA: Diagnosis present

## 2023-10-15 DIAGNOSIS — F17210 Nicotine dependence, cigarettes, uncomplicated: Secondary | ICD-10-CM | POA: Diagnosis present

## 2023-10-15 DIAGNOSIS — G8191 Hemiplegia, unspecified affecting right dominant side: Secondary | ICD-10-CM | POA: Diagnosis present

## 2023-10-15 DIAGNOSIS — I639 Cerebral infarction, unspecified: Secondary | ICD-10-CM | POA: Diagnosis not present

## 2023-10-15 DIAGNOSIS — G459 Transient cerebral ischemic attack, unspecified: Secondary | ICD-10-CM | POA: Diagnosis not present

## 2023-10-15 DIAGNOSIS — Z79899 Other long term (current) drug therapy: Secondary | ICD-10-CM | POA: Diagnosis not present

## 2023-10-15 DIAGNOSIS — Z7982 Long term (current) use of aspirin: Secondary | ICD-10-CM | POA: Diagnosis not present

## 2023-10-15 DIAGNOSIS — G894 Chronic pain syndrome: Secondary | ICD-10-CM | POA: Diagnosis present

## 2023-10-15 DIAGNOSIS — R29703 NIHSS score 3: Secondary | ICD-10-CM | POA: Diagnosis present

## 2023-10-15 DIAGNOSIS — I7774 Dissection of vertebral artery: Secondary | ICD-10-CM | POA: Diagnosis present

## 2023-10-15 DIAGNOSIS — E785 Hyperlipidemia, unspecified: Secondary | ICD-10-CM | POA: Diagnosis present

## 2023-10-15 DIAGNOSIS — Z7984 Long term (current) use of oral hypoglycemic drugs: Secondary | ICD-10-CM | POA: Diagnosis not present

## 2023-10-15 LAB — LIPID PANEL
Cholesterol: 185 mg/dL (ref 0–200)
HDL: 64 mg/dL (ref >40)
LDL Cholesterol: 93 mg/dL (ref 0–99)
Total CHOL/HDL Ratio: 2.9 ratio
Triglycerides: 141 mg/dL (ref <150)
VLDL: 28 mg/dL (ref 0–40)

## 2023-10-15 LAB — GLUCOSE, CAPILLARY
Glucose-Capillary: 163 mg/dL — ABNORMAL HIGH (ref 70–99)
Glucose-Capillary: 200 mg/dL — ABNORMAL HIGH (ref 70–99)
Glucose-Capillary: 211 mg/dL — ABNORMAL HIGH (ref 70–99)
Glucose-Capillary: 300 mg/dL — ABNORMAL HIGH (ref 70–99)
Glucose-Capillary: 180 mg/dL — ABNORMAL HIGH (ref 70–99)

## 2023-10-15 LAB — BASIC METABOLIC PANEL WITH GFR
Anion gap: 6 (ref 5–15)
BUN: 11 mg/dL (ref 6–20)
CO2: 26 mmol/L (ref 22–32)
Calcium: 9.3 mg/dL (ref 8.9–10.3)
Chloride: 104 mmol/L (ref 98–111)
Creatinine, Ser: 0.7 mg/dL (ref 0.61–1.24)
GFR, Estimated: >60 mL/min (ref >60)
Glucose, Bld: 173 mg/dL — ABNORMAL HIGH (ref 70–99)
Potassium: 3.9 mmol/L (ref 3.5–5.1)
Sodium: 136 mmol/L (ref 135–145)

## 2023-10-15 LAB — CBC WITH DIFFERENTIAL/PLATELET
Abs Immature Granulocytes: 0.01 10*3/uL (ref 0.00–0.07)
Basophils Absolute: 0 10*3/uL (ref 0.0–0.1)
Basophils Relative: 1 %
Eosinophils Absolute: 0.3 10*3/uL (ref 0.0–0.5)
Eosinophils Relative: 7 %
HCT: 39.7 % (ref 39.0–52.0)
Hemoglobin: 13.4 g/dL (ref 13.0–17.0)
Immature Granulocytes: 0 %
Lymphocytes Relative: 47 %
Lymphs Abs: 2.3 10*3/uL (ref 0.7–4.0)
MCH: 28.6 pg (ref 26.0–34.0)
MCHC: 33.8 g/dL (ref 30.0–36.0)
MCV: 84.6 fL (ref 80.0–100.0)
Monocytes Absolute: 0.5 10*3/uL (ref 0.1–1.0)
Monocytes Relative: 10 %
Neutro Abs: 1.7 10*3/uL (ref 1.7–7.7)
Neutrophils Relative %: 35 %
Platelets: 255 10*3/uL (ref 150–400)
RBC: 4.69 MIL/uL (ref 4.22–5.81)
RDW: 13.1 % (ref 11.5–15.5)
WBC: 4.7 10*3/uL (ref 4.0–10.5)
nRBC: 0 % (ref 0.0–0.2)

## 2023-10-15 LAB — ECHOCARDIOGRAM COMPLETE
AR max vel: 1.81 cm2
AV Area VTI: 1.8 cm2
AV Area mean vel: 1.76 cm2
AV Mean grad: 4 mmHg
AV Peak grad: 8.8 mmHg
Ao pk vel: 1.48 m/s
Area-P 1/2: 3.68 cm2
Calc EF: 57.4 %
Height: 70 in
MV VTI: 2.34 cm2
S' Lateral: 3.3 cm
Single Plane A2C EF: 57.5 %
Single Plane A4C EF: 56.1 %
Weight: 3361.57 [oz_av]

## 2023-10-15 LAB — HIV ANTIBODY (ROUTINE TESTING W REFLEX): HIV Screen 4th Generation wRfx: NONREACTIVE

## 2023-10-15 MED ORDER — OXYCODONE-ACETAMINOPHEN 7.5-325 MG PO TABS
1.0000 | ORAL_TABLET | ORAL | Status: DC | PRN
  Administered 2023-10-15: 1 via ORAL
  Filled 2023-10-15: qty 1

## 2023-10-15 MED ORDER — OXYCODONE-ACETAMINOPHEN 5-325 MG PO TABS
2.0000 | ORAL_TABLET | ORAL | Status: DC | PRN
  Administered 2023-10-15 – 2023-10-17 (×10): 2 via ORAL
  Filled 2023-10-15 (×10): qty 2

## 2023-10-15 MED ORDER — EZETIMIBE 10 MG PO TABS
10.0000 mg | ORAL_TABLET | Freq: Every day | ORAL | Status: DC
  Administered 2023-10-15 – 2023-10-16 (×2): 10 mg via ORAL
  Filled 2023-10-15 (×2): qty 1

## 2023-10-15 NOTE — Progress Notes (Signed)
  Progress Note   Patient: Wesley Bradford UVO:[id number] DOB: 1966-06-26 DOA: 10/14/2023     0 DOS: the patient was seen and examined on 10/15/2023   Brief hospital course: Wesley Bradford is a 58 y.o. male with medical history significant of type 2 diabetes, hypertension, hyperlipidemia, chronic pain syndrome, recurrent pancreatitis, spinal stenosis, who presents to the ED due to dysarthria. He also has right arm weakness. MRI brain showed small vessel disease, no acute changes. CT angio showed occlusion of the right vertebral artery at the origin with multifocal reconstitution and occlusion of the vessel from the V2 segment to the distal V4 segment concerning for vertebral artery dissection. MRI of the cervical spine also showed a severe C4-C5 spinal stenosis. Consulted by neurology and neurosurgery is obtained.   Principal Problem:   Acute CVA (cerebrovascular accident) (HCC) Active Problems:   Type II diabetes mellitus (HCC)   Chronic pain syndrome   Hypertension, essential   Depression   Class 1 obesity   Cervical spinal stenosis   Assessment and Plan: *Likely TIA. Right Vertebral artery occlusion. Patient is presenting with concern for expressive aphasia and dysarthria. At this time, patient is struggling to find his words but also right sided weakness with abnormal FTN. Weakness may be due to known spinal issues. Last known normal is yesterday at 22:30.  Primary suspicion at this time is acute CVA given risk factors including hypertension, hyperlipidemia, uncontrolled diabetes.  No risk factors for embolic etiology. Currently pending neurology consult, MRI brain did not show any acute changes.  CT angiogram showed right vertebral artery occlusion.  Echocardiogram showed ejection fraction 60 to 65% with aortic valve sclerosis without stenosis. LDL 93, not at goal, start Lipitor.  Aspirin also started.  Severe cervical spine stenosis. Source of patient right arm weakness,  neurosurgery consult obtained.  Hypertension, essential - Hold home antihypertensives to allow for permissive hypertension  Chronic pain syndrome Discontinue IV pain medicine, started Percocet.  Type II diabetes mellitus (HCC) History of uncontrolled type 2 diabetes with last A1c of 9.5% approximately 2 months ago. Continue sliding scale insulin, adjust dose as needed.       Subjective:  Patient still complaining of neck pain, right arm weakness.  Physical Exam: Vitals:   10/14/23 1937 10/15/23 0102 10/15/23 0504 10/15/23 0842  BP: 131/70 123/77 (!) 108/49 (!) 159/82  Pulse: 87 82 73 82  Resp: 18 16 16 16   Temp: 98.2 F (36.8 C) 97.8 F (36.6 C) 97.8 F (36.6 C)   TempSrc:      SpO2: 96% 98% 100% 100%  Weight:      Height:       General exam: Appears calm and comfortable  Respiratory system: Clear to auscultation. Respiratory effort normal. Cardiovascular system: S1 & S2 heard, RRR. No JVD, murmurs, rubs, gallops or clicks. No pedal edema. Gastrointestinal system: Abdomen is nondistended, soft and nontender. No organomegaly or masses felt. Normal bowel sounds heard. Central nervous system: Alert and oriented.  Mild right arm weakness. Extremities: Symmetric 5 x 5 power. Skin: No rashes, lesions or ulcers Psychiatry: Judgement and insight appear normal. Mood & affect appropriate.    Data Reviewed:  Reviewed CT scan results, MRI results, lab results.  Family Communication: None  Disposition: Status is: Observation      Time spent: 35 minutes  Author: Marrion Coy, MD 10/15/2023 12:25 PM  For on call review [email or web address].

## 2023-10-15 NOTE — Progress Notes (Signed)
 SLP Cancellation Note  Patient Details Name: Wesley Bradford MRN: [id number] DOB: 12-05-1965   Cancelled treatment:       Reason Eval/Treat Not Completed: SLP screened, no needs identified, will sign off (chart reviewed; consulted pt/adult children and NSG)  Pt denied any difficulty swallowing and is currently on a regular diet; tolerates swallowing pills w/ water per NSG. Pt feeding himself his lunch meal (Regular diet, thins) w/ no overt s/s of aspiration noted. Discussed general aspiration precautions w/ both pt and Adult Children in the room, including sitting fully upright and reducing distractions in the room while eating/drinking. Pt agreed. No further skilled ST services indicated as pt appears at his swallowing baseline. Pt agreed. NSG to reconsult if any change in status while admitted.     [name], MS, CCC-SLP Speech Language Pathologist Rehab Services; [facility] [phone] (ascom) [name] 10/15/2023, 3:38 PM

## 2023-10-15 NOTE — Plan of Care (Signed)
  Problem: Ischemic Stroke/TIA Tissue Perfusion: Goal: Complications of ischemic stroke/TIA will be minimized Outcome: Progressing   Problem: Education: Goal: Knowledge of disease or condition will improve Outcome: Progressing Goal: Knowledge of secondary prevention will improve  Hypertension and Hyperlipidemia Outcome: Progressing Goal: Knowledge of patient specific risk factors will improve  Smoking and EtOH use/abuse Outcome: Progressing   Problem: Coping: Goal: Will verbalize positive feelings about self Outcome: Progressing Goal: Will identify appropriate support needs Outcome: Progressing   Problem: Health Behavior/Discharge Planning: Goal: Ability to manage health-related needs will improve Outcome: Progressing Goal: Goals will be collaboratively established with patient/family Outcome: Progressing   Problem: Self-Care: Goal: Ability to participate in self-care as condition permits will improve Outcome: Progressing Goal: Verbalization of feelings and concerns over difficulty with self-care will improve Outcome: Progressing Goal: Ability to communicate needs accurately will improve Outcome: Progressing   Problem: Nutrition: Goal: Risk of aspiration will decrease Outcome: Progressing Goal: Dietary intake will improve Outcome: Progressing

## 2023-10-15 NOTE — Plan of Care (Signed)

## 2023-10-15 NOTE — Hospital Course (Signed)
 Wesley Bradford is a 57 y.o. male with medical history significant of type 2 diabetes, hypertension, hyperlipidemia, chronic pain syndrome, recurrent pancreatitis, spinal stenosis, who presents to the ED due to dysarthria. He also has right arm weakness. MRI brain showed small vessel disease, no acute changes. CT angio showed occlusion of the right vertebral artery at the origin with multifocal reconstitution and occlusion of the vessel from the V2 segment to the distal V4 segment concerning for vertebral artery dissection. MRI of the cervical spine also showed a severe C4-C5 spinal stenosis. Consulted by neurology and neurosurgery is obtained.

## 2023-10-15 NOTE — Evaluation (Signed)
 Speech Language Pathology Evaluation Patient Details Name: Wesley Bradford MRN: [id number] DOB: June 25, 1966 Today's Date: 10/15/2023 Time: 1330-1430 SLP Time Calculation (min) (ACUTE ONLY): 60 min  Problem List:  Patient Active Problem List   Diagnosis Date Noted   Class 1 obesity 10/15/2023   Cervical spinal stenosis 10/15/2023   Acute CVA (cerebrovascular accident) (HCC) 10/14/2023   Spinal stenosis of lumbar region with neurogenic claudication 04/16/2022   Lumbar radiculopathy 01/11/2022   Erectile dysfunction 08/09/2011   Type II diabetes mellitus (HCC) 01/17/2011   Chronic pain syndrome 01/17/2011   Hypertension, essential 01/17/2011   Hyperlipidemia LDL goal <100 01/17/2011   Depression 01/17/2011   Past Medical History:  Past Medical History:  Diagnosis Date   Diabetes mellitus without complication (HCC)    Hypertension    Past Surgical History: History reviewed. No pertinent surgical history. HPI:  Pt is a 58 y.o. male with medical history significant of type 2 diabetes, hypertension, hyperlipidemia, chronic pain syndrome, recurrent pancreatitis, spinal stenosis, who presents to the ED on 10/14/23 due to changes in his speech and Right arm weakness.   MRI brain showed small vessel disease, no acute changes.  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  MRI of the cervical spine also showed a severe C4-C5 spinal stenosis.  Consulted by Neurology.   Assessment / Plan / Recommendation Clinical Impression   Pt seen for informal assessment of cognitive-communication abilities at the bedside using components of the Western Aphasic Battery(WAB) and SLUMS. Pt was awake, verbal and engaged appropriately during session. He was talkative and pleasant. Pt had just finished eating his Lunch meal. Daughter and Son  present. On RA, afebrile. WBC WNL.  OF NOTE: pt's roommate(double room) seemed agitated and presented much distraction during the assessment. (NSG made aware)   Pt presents with grossly functional cognitive-communication skills and abilities w/ No gross/overt s/s of communication deficits; no overt expressive and receptive aphasia nor dysarthria noted.  Pt's Daughter and Son present in the room during most of the session reported pt's speech-communication presentation appears at his Baseline "today".   Pt's speech is mainly fluent at the word-conversation level with inconsistent instances of hesitation w/ lengthier speech response to questions/task c/b paucity of speech in that moment; but given time, pt was able to give further detail to the task. Pt's motor speech pattern was c/b min low volume of speech at times; missing dentition may have also added to the "mumbled" quality of his speech. His Adult Children/familiar listeners indicated this was Baseline for pt and he was fully intelligible to them and to this SLP (w/ min increased volume x2-3 responses). Also noted he conversed via phone w/out difficulty upon entering room prior to initiation of the assessment.   Pt w/ intact confrontation naming of basic objects, basic responsive naming, object function, and repetition. Pt was able to ID object similarities/contrasts appropriately.   Pt demonstrated intact auditory comprehension for Basic information, especially re: self and immediate environment. He was able to complete more complex information tasks (e.g. complex yes/no questions, 2-step/multistep commands) appropriately -- immediate recognized any errors and self-repaired w/ a chuckle. Pt's expressive language skills were c/b fair detail in picture description task; min more noun/verb/action responses. He gave further detail when cued and ID'd a problem situation/solution when questioned about it. During general Math, Memory, and Problem-solving  tasks, he exhibited functional skills -- accuracy improved w/ cue or repetition offered: Recall 2/3 (w/ many distractions in the room); w/ cue/aid, accuracy was 3/3. Endorsed that he needed to call "911" during ID'd emergency situations presented; he also expresssed that he wanted to talk w/ his PCP about his current dx and the tx options ahead "b/f making any decisions". At times, he seemed repetitive in "talking things out" to himself stating that's how he "worked out the task".   Pt participated in skilled SLP services targeting functional expressive/receptive communication and Cognitive-communication abilities. Pt appears to demonstrate functional communication skills/abilities. Pt's Adult Children were present and endorsed pt seems at his communication Baseline to them "today".  Education was provided on changes to cognitive-communication abilities in setting of any neurological changes. Information was also shared about basic communication strategies and SLP POC recommendation.  Based on today's assessment, recommend any further assessment of cognitive-communication skills be completed post Discharge to next venue of care post acuity of illness and/or if any needs indicate f/u post return to home environment in his ADLs.  This was discussed w/ pt and Adult Children in the room who agreed. Pt/Children voiced no communication concerns/changes at this time; "I feel like I am talking like I usually do", per pt report.     SLP Assessment  SLP Recommendation/Assessment: All further Speech Lanaguage Pathology  needs can be addressed in the next venue of care (if any needs indicate f/u post return to home environment in his ADLs) SLP Visit Diagnosis: Cognitive communication deficit (R41.841)    Recommendations for follow up therapy are one component of a multi-disciplinary discharge planning process, led by the attending physician.  Recommendations may be updated based on patient status, additional  functional criteria and insurance authorization.    Follow Up Recommendations  Follow physician's recommendations for discharge plan and follow up therapies (at next venue of care if needs indicate)    Assistance Recommended at Discharge  Set up Supervision/Assistance  Functional Status Assessment Patient has had a recent decline in their functional status and demonstrates the ability to make significant improvements in function in a reasonable and predictable amount of time.  Frequency and Duration  (n/a)   (n/a)      SLP Evaluation Cognition  Overall Cognitive Status: Within Functional Limits for tasks assessed Arousal/Alertness: Awake/alert Orientation Level: Oriented X4 Year: 2025 Month: April Attention: Focused;Sustained Focused Attention: Appears intact Sustained Attention: Appears intact Memory: Appears intact (recall 2/3 w/ many distractions; w/ cue/aid, accuracy was 3/3) Awareness: Appears intact Problem Solving: Appears intact Executive Function: Reasoning Reasoning: Appears intact (see below under problem-solving) Behaviors:  (at times repetitive in talking things out to himself to work out the task) Safety/Judgment: Appears intact Comments: knew that he needed to call "911" during ID'd emergency situations; he also expresssed that he wanted to talk w/ his PCP about his current dx and the tx options ahead "b/f making any decisions".       Comprehension  Auditory Comprehension Overall Auditory Comprehension: Appears within functional limits for tasks assessed Yes/No Questions: Within Functional Limits (mod complex -- he self-corrected immediately laughing) Commands: Within Functional Limits (2-step) Conversation: Complex Other Conversation Comments: pt's speech presentation/pattern is at his Baseline per Son/Daughter present in room Interfering Components:  (no gross) EffectiveTechniques: Repetition (when needed for clarification) Visual  Recognition/Discrimination Discrimination: Not tested Reading Comprehension Reading Status: Not tested    Expression Expression Primary Mode of Expression: Verbal Verbal Expression Overall Verbal Expression: Appears within functional limits for  tasks assessed Initiation: No impairment Automatic Speech:  (wfl) Level of Generative/Spontaneous Verbalization: Conversation Repetition: No impairment Naming: No impairment Pragmatics: No impairment Interfering Components:  (low volume of speech at times; missing dentition (mumbled speech)) Non-Verbal Means of Communication: Not applicable Written Expression Dominant Hand: Right Written Expression: Not tested   Oral / Motor  Oral Motor/Sensory Function Overall Oral Motor/Sensory Function: Within functional limits Motor Speech Overall Motor Speech: Appears within functional limits for tasks assessed Respiration: Within functional limits Phonation: Normal Resonance: Within functional limits Articulation: Within functional limitis (at his Baseline per children in room) Intelligibility: Intelligible Interfering Components: Inadequate dentition             Wesley Som, MS, CCC-SLP Speech Language Pathologist Rehab Services; [facility] [phone] (ascom) Mychelle Kendra 10/15/2023, 3:08 PM

## 2023-10-15 NOTE — Evaluation (Signed)
 Occupational Therapy Evaluation Patient Details Name: Wesley Bradford MRN: [id number] DOB: 13-Dec-1965 Today's Date: 10/15/2023   History of Present Illness   Pt is a 58 y.o. male  who presents to the ED due to dysarthria. MRI and CT head negative. 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 PMH of DM2, hypertension, hyperlipidemia, chronic pain syndrome, recurrent pancreatitis, spinal stenosis.     Clinical Impressions Pt was seen for OT evaluation this date. PTA, pt was living at home with his wife with 5 STE and bil HR. Pt was IND with ADLs and IADLs, driving and community mobile. Reports typically ambulating household distances without AD and using a SPC or QC for community distances.  Pt presents to acute OT demonstrating impaired ADL performance and functional mobility 2/2 weakness, pain, dizziness, Pt currently requires MOD I for bed mobility, SUP for STS and CGA via HHA for SPT and in room mobility ~8-10 feet. He performed LB dressing to don pants and shoes with SUP and cueing for pacing. Pt reporting mild lightheadedness therefore orthostatic vitals taken:  BP- Sitting BP- Standing at 0 minutes BP- Standing at 3 minutes  10/15/23 1003 138/87 128/76 106/86   Pt reports 7/10 lightheadedness and wishes to sit down prior to finishing standing 3 min vitals. Echo arrived and pt returned to bed. Notified nurse and MD of orthostatic vital readings. Pt would benefit from skilled OT services to address noted impairments and functional limitations (see below for any additional details) in order to maximize safety and independence while minimizing falls risk and caregiver burden. Do anticipate the need for follow up OT services upon acute hospital DC.       If plan is discharge home, recommend the following:   A little help with walking and/or transfers;A little help with bathing/dressing/bathroom;Assist for transportation;Help with stairs or ramp for entrance      Functional Status Assessment   Patient has had a recent decline in their functional status and demonstrates the ability to make significant improvements in function in a reasonable and predictable amount of time.     Equipment Recommendations   BSC/3in1     Recommendations for Other Services         Precautions/Restrictions   Precautions Precautions: Fall Restrictions Weight Bearing Restrictions Per Provider Order: No     Mobility Bed Mobility Overal bed mobility: Modified Independent                  Transfers Overall transfer level: Needs assistance   Transfers: Sit to/from Stand, Bed to chair/wheelchair/BSC Sit to Stand: Supervision     Step pivot transfers: Contact guard assist     General transfer comment: HHA for SPT and ambulation ~10 feet total with return to bed d/t dizziness      Balance Overall balance assessment: Needs assistance Sitting-balance support: Feet supported Sitting balance-Leahy Scale: Good Sitting balance - Comments: steady for LB dressing seated   Standing balance support: Single extremity supported Standing balance-Leahy Scale: Fair Standing balance comment: HHA and CGA d/t dizziness                           ADL either performed or assessed with clinical judgement   ADL Overall ADL's : Needs assistance/impaired                 Upper Body Dressing : Independent   Lower Body Dressing: Supervision/safety;Contact guard assist;Sit to/from stand;Sitting/lateral leans Lower Body  Dressing Details (indicate cue type and reason): to don shoes and pants seated in chair Toilet Transfer: Contact guard assist;Supervision/safety Toilet Transfer Details (indicate cue type and reason): simulated via HHA to chair in room                 Vision         Perception         Praxis         Pertinent Vitals/Pain Pain Assessment Pain Assessment: 0-10 Pain Score: 8  Pain Location: neck all the way  down his back-chronic Pain Descriptors / Indicators: Aching Pain Intervention(s): Monitored during session, Repositioned, RN gave pain meds during session     Extremity/Trunk Assessment Upper Extremity Assessment Upper Extremity Assessment: Overall WFL for tasks assessed   Lower Extremity Assessment Lower Extremity Assessment: Generalized weakness       Communication Communication Communication: No apparent difficulties Factors Affecting Communication: Difficulty expressing self   Cognition Arousal: Alert Behavior During Therapy: WFL for tasks assessed/performed Cognition: No apparent impairments             OT - Cognition Comments: able to state name, location, situation, and time                 Following commands: Intact       Cueing  General Comments   Cueing Techniques: Verbal cues  orthostatic positive with dizziness   Exercises Other Exercises Other Exercises: Edu on role of OT in acute setting.   Shoulder Instructions      Home Living Family/patient expects to be discharged to:: Private residence Living Arrangements: Spouse/significant other Available Help at Discharge: Family;Available 24 hours/day Type of Home: Mobile home Home Access: Stairs to enter Entrance Stairs-Number of Steps: 4-5 STE Entrance Stairs-Rails: Right;Left Home Layout: One level     Bathroom Shower/Tub: Producer, television/film/video: Handicapped height     Home Equipment: Shower seat - built in;Grab bars - tub/shower;Rolling Environmental consultant (2 wheels);Rollator (4 wheels);Cane - single point;Cane - quad          Prior Functioning/Environment Prior Level of Function : Independent/Modified Independent;Driving             Mobility Comments: occasional use of SPC or QCin the home, but typically IND and used devices when going out ADLs Comments: IND/MOD I    OT Problem List: Decreased strength;Decreased activity tolerance;Impaired balance (sitting and/or standing)    OT Treatment/Interventions: Self-care/ADL training;Balance training;Therapeutic exercise;Therapeutic activities;DME and/or AE instruction;Patient/family education      OT Goals(Current goals can be found in the care plan section)   Acute Rehab OT Goals Patient Stated Goal: return home OT Goal Formulation: With patient/family Time For Goal Achievement: 10/29/23 Potential to Achieve Goals: Good ADL Goals Pt Will Transfer to Toilet: with supervision;regular height toilet;ambulating Pt Will Perform Toileting - Clothing Manipulation and hygiene: with supervision;sit to/from stand;sitting/lateral leans Additional ADL Goal #1: Pt will demo/verbalize implementation of ECS and fall risk prevention strategies during ADLs and transfers/mobility to prevent overrexertion and falls upon DC home.   OT Frequency:  Min 2X/week    Co-evaluation              AM-PAC OT "6 Clicks" Daily Activity     Outcome Measure Help from another person eating meals?: None Help from another person taking care of personal grooming?: None Help from another person toileting, which includes using toliet, bedpan, or urinal?: A Little Help from another person bathing (including washing, rinsing, drying)?: A Little  Help from another person to put on and taking off regular upper body clothing?: None Help from another person to put on and taking off regular lower body clothing?: A Little 6 Click Score: 21   End of Session Nurse Communication: Mobility status  Activity Tolerance: Patient tolerated treatment well (limited by dizziness) Patient left: in bed (echo)  OT Visit Diagnosis: Other abnormalities of gait and mobility (R26.89);Muscle weakness (generalized) (M62.81);Dizziness and giddiness (R42)                Time: 1610-9604 OT Time Calculation (min): 32 min Charges:  OT General Charges $OT Visit: 1 Visit OT Evaluation $OT Eval Moderate Complexity: 1 Mod OT Treatments $Self Care/Home Management : 8-22  mins Zakhia Seres, OTR/L 10/15/23, 10:11 AM  Alonza Knisley E Madalyne Husk 10/15/2023, 10:08 AM

## 2023-10-15 NOTE — Progress Notes (Signed)
*  PRELIMINARY RESULTS* Echocardiogram 2D Echocardiogram has been performed.  [name] 10/15/2023, 10:45 AM

## 2023-10-15 NOTE — Consult Note (Signed)
 Consult requested by:  Wesley Bradford  Consult requested for:  Cervical stenosis  Primary Physician:  Wesley Knee, MD  History of Present Illness: 10/15/2023 Wesley Bradford is here today with a chief complaint of slurred speech and RUE weakness who presented to the hospital tomorrow and was diagnosed with a TIA.    He has history of low back surgery many years ago, which left him with R weakness and difficulty walking. He and his wife deny any recent changes in balance or dexterity until his presentation.  He has diabetes which has been controlled.    I have utilized the care everywhere function in epic to review the outside records available from external health systems.  Review of Systems:  A 10 point review of systems is negative, except for the pertinent positives and negatives detailed in the HPI.  Past Medical History: Past Medical History:  Diagnosis Date   Diabetes mellitus without complication (HCC)    Hypertension     Past Surgical History: History reviewed. No pertinent surgical history.  Allergies: Allergies as of 10/14/2023   (No Known Allergies)    Medications: Current Meds  Medication Sig   atorvastatin (LIPITOR) 80 MG tablet Take 80 mg by mouth daily.   calcium carbonate (TUMS - DOSED IN MG ELEMENTAL CALCIUM) 500 MG chewable tablet Chew 1 tablet by mouth daily.   HYDROcodone-acetaminophen (NORCO) 10-325 MG tablet Take 1 tablet by mouth every 8 (eight) hours as needed.   JANUVIA 100 MG tablet Take 100 mg by mouth daily.   lisinopril (ZESTRIL) 40 MG tablet Take 40 mg by mouth daily.   metFORMIN (GLUCOPHAGE) 1000 MG tablet Take 1,000 mg by mouth 2 (two) times daily.   Multiple Vitamin (MULTIVITAMIN) tablet Take 1 tablet by mouth daily.   tamsulosin (FLOMAX) 0.4 MG CAPS capsule Take 0.4 mg by mouth daily. At bedtime    Social History: Social History   Tobacco Use   Smoking status: Former   Smokeless tobacco: Never  Vaping Use   Vaping  status: Never Used  Substance Use Topics   Alcohol use: Yes    Alcohol/week: 2.0 standard drinks of alcohol    Types: 2 Cans of beer per week   Drug use: Never    Family Medical History: History reviewed. No pertinent family history.  Physical Examination: Vitals:   10/15/23 1253 10/15/23 1622  BP: 127/71 (!) 155/87  Pulse: 79 77  Resp: 16 17  Temp: 98 F (36.7 C) 98.1 F (36.7 C)  SpO2:  99%    General: Patient is in no apparent distress. Attention to examination is appropriate.  Neck:   Supple.  Full range of motion.  Respiratory: Patient is breathing without any difficulty.   NEUROLOGICAL:     Awake, alert, oriented to person, place, and time.  Speech is clear and fluent.  Cranial Nerves: Pupils equal round and reactive to light.  Facial tone is symmetric.  Facial sensation is symmetric. Shoulder shrug is symmetric. Tongue protrusion is midline.  There is no pronator drift.  Strength: Side Biceps Triceps Deltoid Interossei Grip Wrist Ext. Wrist Flex.  R 4+ 4 4 3 3  4- 4-  L 5 5 5  4- 4- 5 5   Side Iliopsoas Quads Hamstring PF DF EHL  R 5 5 5 5 5 5   L 5 5 5 5 5 5    Reflexes are 1+ and symmetric at the biceps, triceps, brachioradialis, patella and achilles.   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  Bilateral upper and lower extremity sensation is intact to light touch.    No evidence of dysmetria noted.  Gait is untested.     Medical Decision Making  Imaging: MRI C spine 10/14/2023 IMPRESSION: Degenerative changes of the cervical spine as above. Disc osteophyte complex and thickening of the ligamentum flavum at C4-5 contributing to severe spinal canal stenosis. Cord compression at C4-5 with associated cord signal abnormality concerning for myelomalacia versus edema.   Prevertebral edema from C2-C4, likely secondary to degenerative changes versus mild ligamentous injury.   Multilevel foraminal stenosis, greatest and severe bilaterally at C4-5.   Small  effusion involving the right atlantooccipital articulation.     Electronically Signed   By: Wesley Bradford M.D.   On: 10/14/2023 19:49 I have personally reviewed the images and agree with the above interpretation.  Assessment and Plan: Wesley Bradford is a pleasant 58 y.o. male with severe cervical stenosis that could be causing his RUE weakness and impacting his balance.  He walks with a rollator.  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  Due to his symptoms, I think it is reasonable to consider intervention.  Conservative management is not indicated in cervical myelopathy.  I recommended that he consider C4-5 anterior cervical discectomy and fusion.  He would like to think it over and will let us know tomorrow, in which case we would plan for surgery Thursday.  My partner, Wesley Bradford, is operating that day and would take care of the operative case.  If Wesley Bradford elects against surgery, he will start dual antiplatelets at the direction of Wesley Bradford and we would schedule outpatient follow up.    I have communicated my recommendations to the requesting physician and coordinated care to facilitate these recommendations.     Wesley Felling K. Myer Haff MD, [facility] Neurosurgery

## 2023-10-15 NOTE — Progress Notes (Signed)
 PT Cancellation Note  Patient Details Name: JAILIN MOOMAW MRN: [id number] DOB: [birth year]   Cancelled Treatment:    Reason Eval/Treat Not Completed: Other (comment). Pt with pending neurosx consult due to possible R vertebral artery dissection. Will hold and await mobility clearance.   Ariannah Arenson 10/15/2023, 8:45 AM Elizabeth Palau, PT, DPT, GCS [phone]

## 2023-10-16 DIAGNOSIS — I639 Cerebral infarction, unspecified: Secondary | ICD-10-CM | POA: Diagnosis not present

## 2023-10-16 LAB — GLUCOSE, CAPILLARY
Glucose-Capillary: 262 mg/dL — ABNORMAL HIGH (ref 70–99)
Glucose-Capillary: 202 mg/dL — ABNORMAL HIGH (ref 70–99)
Glucose-Capillary: 324 mg/dL — ABNORMAL HIGH (ref 70–99)
Glucose-Capillary: 96 mg/dL (ref 70–99)

## 2023-10-16 MED ORDER — INSULIN GLARGINE-YFGN 100 UNIT/ML ~~LOC~~ SOLN
5.0000 [IU] | Freq: Every day | SUBCUTANEOUS | Status: DC
  Administered 2023-10-16: 5 [IU] via SUBCUTANEOUS
  Filled 2023-10-16: qty 0.05

## 2023-10-16 NOTE — Plan of Care (Signed)

## 2023-10-16 NOTE — Progress Notes (Signed)
 PT Cancellation Note  Patient Details Name: Wesley Bradford MRN: [id number] DOB: 05-Dec-1965   Cancelled Treatment:    Reason Eval/Treat Not Completed: Other (comment). Per discussion with doctor, pt pending neuro Sx. Agreed to wait until after surgery for PT consult. Will continue to follow.   Rosemary Pentecost 10/16/2023, 11:35 AM Elizabeth Palau, PT, DPT, GCS [phone]

## 2023-10-16 NOTE — Progress Notes (Signed)
 Progress Note   Patient: Wesley Bradford ZOX:[id number] DOB: Feb 23, 1966 DOA: 10/14/2023     1 DOS: the patient was seen and examined on 10/16/2023   Brief hospital course: Wesley Bradford is a 58 y.o. male with medical history significant of type 2 diabetes, hypertension, hyperlipidemia, chronic pain syndrome, recurrent pancreatitis, spinal stenosis, who presents to the ED due to dysarthria. He also has right arm weakness. MRI brain showed small vessel disease, no acute changes. CT angio showed occlusion of the right vertebral artery at the origin with multifocal reconstitution and occlusion of the vessel from the V2 segment to the distal V4 segment concerning for vertebral artery dissection. MRI of the cervical spine also showed a severe C4-C5 spinal stenosis. Consulted by neurology and neurosurgery is obtained.   Principal Problem:   Acute CVA (cerebrovascular accident) (HCC) Active Problems:   Type II diabetes mellitus (HCC)   Chronic pain syndrome   Hypertension, essential   Depression   Class 1 obesity   Cervical spinal stenosis   Cervical myelopathy (HCC)   Assessment and Plan: *Likely TIA. Right Vertebral artery occlusion. Patient is presenting with concern for expressive aphasia and dysarthria. At this time, patient is struggling to find his words but also right sided weakness with abnormal FTN. Weakness may be due to known spinal issues. Last known normal is yesterday at 22:30.  Primary suspicion at this time is acute CVA given risk factors including hypertension, hyperlipidemia, uncontrolled diabetes.  No risk factors for embolic etiology. Currently pending neurology consult, MRI brain did not show any acute changes.  CT angiogram showed right vertebral artery occlusion.  Echocardiogram showed ejection fraction 60 to 65% with aortic valve sclerosis without stenosis. LDL 93, not at goal, start Lipitor.  Aspirin also started. Holding on plavix given possible surgery.  -  message to neurology - consult placed 2 days ago but no documentation that I can see in the chart  Severe cervical spine stenosis. With myelopathy. Source of patient right arm weakness, right leg weakness, ambulatory dysfunction, neurosurgery consult obtained, advising acdf. Patient mulling this over. Will need pt/ot evals  Hypertension, essential - Hold home antihypertensives to allow for permissive hypertension  Chronic pain syndrome Discontinue IV pain medicine, started Percocet.  Type II diabetes mellitus (HCC) History of uncontrolled type 2 diabetes with last A1c of 9.5% approximately 2 months ago. Continue sliding scale insulin, adjust dose as needed. Will add long-acting  Obesity noted       Subjective:  Speech back to baseline, no new complaints  Physical Exam: Vitals:   10/15/23 1957 10/15/23 2339 10/16/23 0401 10/16/23 0746  BP: 128/82 102/72 119/68 120/63  Pulse: 76 85 63 63  Resp: 16 16 16 18   Temp: 98.6 F (37 C) 98.1 F (36.7 C) 98.4 F (36.9 C) 97.8 F (36.6 C)  TempSrc: Oral Oral Oral Oral  SpO2: 100% 99% 99% 98%  Weight:      Height:       General exam: Appears calm and comfortable  Respiratory system: Clear to auscultation. Respiratory effort normal. Cardiovascular system: S1 & S2 heard, RRR. No JVD, murmurs, rubs, gallops or clicks. No pedal edema. Gastrointestinal system: Abdomen is nondistended, soft and nontender. No organomegaly or masses felt. Normal bowel sounds heard. Central nervous system: Alert and oriented.  right arm weakness and right leg weakness Extremities: Symmetric 5 x 5 power. Skin: No rashes, lesions or ulcers Psychiatry: Judgement and insight appear normal. Mood & affect appropriate.    Data Reviewed:  Reviewed CT scan results, MRI results, lab results.  Family Communication: None  Disposition: Status is: inpt      Time spent: 35 minutes  Author: Silvano Bilis, MD 10/16/2023 10:45 AM  For on call review  [email or web address].

## 2023-10-16 NOTE — Plan of Care (Signed)
  Problem: Ischemic Stroke/TIA Tissue Perfusion: Goal: Complications of ischemic stroke/TIA will be minimized Outcome: Progressing   Problem: Education: Goal: Knowledge of disease or condition will improve Outcome: Progressing Goal: Knowledge of secondary prevention will improve (MUST DOCUMENT ALL) Outcome: Progressing Goal: Knowledge of patient specific risk factors will improve (DELETE if not current risk factor) Outcome: Progressing   Problem: Coping: Goal: Will verbalize positive feelings about self Outcome: Progressing Goal: Will identify appropriate support needs Outcome: Progressing   Problem: Health Behavior/Discharge Planning: Goal: Ability to manage health-related needs will improve Outcome: Progressing Goal: Goals will be collaboratively established with patient/family Outcome: Progressing   Problem: Self-Care: Goal: Ability to participate in self-care as condition permits will improve Outcome: Progressing Goal: Verbalization of feelings and concerns over difficulty with self-care will improve Outcome: Progressing Goal: Ability to communicate needs accurately will improve Outcome: Progressing   Problem: Nutrition: Goal: Risk of aspiration will decrease Outcome: Progressing Goal: Dietary intake will improve Outcome: Progressing

## 2023-10-16 NOTE — Consult Note (Signed)
 NEUROLOGY CONSULT NOTE   Date of service: October 16, 2023 Patient Name: Wesley Bradford MRN:  [id number] DOB:  Mar 16, 1966 Chief Complaint: TIA and cervical stenosis Requesting Provider: Kathrynn Running, MD  History of Present Illness  Wesley Bradford is a 58 y.o. male with a past medical history significant for type 2 diabetes, hypertension, hyperlipidemia, chronic pain syndrome, recurrent pancreatitis, spinal stenosis who presented to the ED for transient dysarthria and word finding difficulty that has now resolved.  Patient and his daughter at bedside states that his voice is now back to normal.  He had an MRI which showed no acute process and dysarthria was attributed to CTA.  He was noted to have bilateral right greater than left weakness in his upper extremities on examination and MRI C-spine was performed.  This showed's significant stenosis particularly in the C4-C5 area with cord compression.  Neurosurgery was consulted who recommended ACDF when neurology felt it was safe to do so.  Patient is considering this option but has not made a decision if he wishes to go ahead with surgery.  CTA shows severe left cavernous ICA stenosis as well as a chronic right vertebral dissection and occlusion.  CT was unremarkable.   ROS  Comprehensive ROS performed and pertinent positives documented in HPI   Past History   Past Medical History:  Diagnosis Date   Diabetes mellitus without complication (HCC)    Hypertension     History reviewed. No pertinent surgical history.  Family History: History reviewed. No pertinent family history.  Social History  reports that he has quit smoking. He has never used smokeless tobacco. He reports current alcohol use of about 2.0 standard drinks of alcohol per week. He reports that he does not use drugs.  No Known Allergies  Medications   Current Facility-Administered Medications:    acetaminophen (TYLENOL) tablet 650 mg, 650 mg, Oral, Q4H PRN **OR**  acetaminophen (TYLENOL) 160 MG/5ML solution 650 mg, 650 mg, Per Tube, Q4H PRN **OR** acetaminophen (TYLENOL) suppository 650 mg, 650 mg, Rectal, Q4H PRN, Verdene Lennert, MD   aspirin chewable tablet 324 mg, 324 mg, Oral, Once, Marinell Blight, MD   aspirin EC tablet 81 mg, 81 mg, Oral, Daily, Verdene Lennert, MD, 81 mg at 10/16/23 0803   atorvastatin (LIPITOR) tablet 80 mg, 80 mg, Oral, Daily, Verdene Lennert, MD, 80 mg at 10/15/23 2149   enoxaparin (LOVENOX) injection 47.5 mg, 0.5 mg/kg, Subcutaneous, Q24H, Huel Cote, Iulia, MD, 47.5 mg at 10/15/23 1309   ezetimibe (ZETIA) tablet 10 mg, 10 mg, Oral, Daily, Chipper Herb, Dekui, MD, 10 mg at 10/15/23 2150   insulin aspart (novoLOG) injection 0-15 Units, 0-15 Units, Subcutaneous, TID WC, Verdene Lennert, MD, 8 Units at 10/16/23 0804   insulin glargine-yfgn (SEMGLEE) injection 5 Units, 5 Units, Subcutaneous, QHS, Wouk, Wilfred Curtis, MD   ondansetron [facility]) injection 4 mg, 4 mg, Intravenous, Q6H PRN, Verdene Lennert, MD   oxyCODONE-acetaminophen (PERCOCET/ROXICET) 5-325 MG per tablet 2 tablet, 2 tablet, Oral, Q4H PRN, Marrion Coy, MD, 2 tablet at 10/16/23 1004   senna-docusate (Senokot-S) tablet 1 tablet, 1 tablet, Oral, QHS PRN, Verdene Lennert, MD  Vitals   Vitals:   10/15/23 1957 10/15/23 2339 10/16/23 0401 10/16/23 0746  BP: 128/82 102/72 119/68 120/63  Pulse: 76 85 63 63  Resp: 16 16 16 18   Temp: 98.6 F (37 C) 98.1 F (36.7 C) 98.4 F (36.9 C) 97.8 F (36.6 C)  TempSrc: Oral Oral Oral Oral  SpO2: 100% 99% 99% 98%  Weight:      Height:        Body mass index is 30.15 kg/m.  Physical Exam   Gen: patient lying in bed, NAD CV: extremities appear well-perfused Resp: normal WOB  Neurologic Examination   MS: alert, oriented x4, follows commands Speech: no dysarthria, no aphasia CN: PERRL, VFF, EOMI, sensation intact, face symmetric, hearing intact to voice Motor: RUE 4/5 throughout except 3/5 grip, LUE 5/5 throughout except 4/5  grip, BLE full strength Sensory: SILT Coordination: FNF intact with dysmetria but no frank ataxia Gait: deferred  Labs/Imaging/Neurodiagnostic studies   CBC:  Recent Labs  Lab 10/20/2023 1026 10/15/23 0459  WBC 4.4 4.7  NEUTROABS 2.4 1.7  HGB 14.4 13.4  HCT 42.9 39.7  MCV 83.3 84.6  PLT 288 255   Basic Metabolic Panel:  Lab Results  Component Value Date   NA 136 10/15/2023   K 3.9 10/15/2023   CO2 26 10/15/2023   GLUCOSE 173 (H) 10/15/2023   BUN 11 10/15/2023   CREATININE 0.70 10/15/2023   CALCIUM 9.3 10/15/2023   GFRNONAA >60 10/15/2023   Lipid Panel:  Lab Results  Component Value Date   LDLCALC 93 10/15/2023   HgbA1c: No results found for: "HGBA1C" Urine Drug Screen: No results found for: "LABOPIA", "COCAINSCRNUR", "LABBENZ", "AMPHETMU", "THCU", "LABBARB"  Alcohol Level     Component Value Date/Time   ETH <10 Oct 20, 2023 1026   INR  Lab Results  Component Value Date   INR 0.9 2023/10/20   APTT  Lab Results  Component Value Date   APTT 27 10/20/23   AED levels: No results found for: "PHENYTOIN", "ZONISAMIDE", "LAMOTRIGINE", "LEVETIRACETA"   CT angio Head and Neck with contrast(Personally reviewed): Occlusion of the right vertebral artery at the origin with multifocal reconstitution and occlusion of the vessel from the V2 segment to the distal V4 segment concerning for vertebral artery dissection. Consider contrast enhanced MRA neck for further evaluation.   Arterial vasculature in the neck is otherwise patent.   Atherosclerosis involving the lacerum segments of the bilateral ICAs as well as the posterior genu of the left cavernous ICA resulting in focal severe stenosis. Intracranial vasculature is otherwise patent.  MR Angio head without contrast and Carotid Duplex BL(Personally reviewed): Redemonstrated occlusion of the right vertebral artery at its origin with multifocal reconstitution and occlusion of the vessel extending to the distal V4  segment. Findings concerning for right vertebral artery dissection.   Arterial vasculature in the neck is otherwise patent.  MRI Brain(Personally reviewed): 1. No acute intracranial abnormality. 2. Minimal chronic small vessel ischemic disease.  MRI c spine (personally reviewed): Degenerative changes of the cervical spine as above. Disc osteophyte complex and thickening of the ligamentum flavum at C4-5 contributing to severe spinal canal stenosis. Cord compression at C4-5 with associated cord signal abnormality concerning for myelomalacia versus edema.   Prevertebral edema from C2-C4, likely secondary to degenerative changes versus mild ligamentous injury.   Multilevel foraminal stenosis, greatest and severe bilaterally at C4-5.   Small effusion involving the right atlantooccipital articulation.  ASSESSMENT   JERMAIN CURT is a 58 y.o. male with a past medical history significant for type 2 diabetes, hypertension, hyperlipidemia, chronic pain syndrome, recurrent pancreatitis, spinal stenosis who presented to the ED for transient dysarthria and word finding difficulty that has now resolved. Episode is favored to be 2/2 TIA in the setting of L cavernous ICA stenosis. It is unrelated to his chronic R vertebral dissection and occlusion. He has severe cervical  stenosis with cord compression causing weakness of his upper extremities. Neurosurgery has offered ACDF this admission and I agree this would be the best course if patient is in agreement. He is deciding if he wants to proceed.  He is currently on aspirin only pending that decision.  If surgery is performed then aspirin should be held and restarted 2 weeks after surgery if cleared by neurosurgery to do so.  It should then be continued as dual antiplatelet therapy in conjunction with aspirin 81 mg until patient is 90 days from TIA at which point patient can be transitioned to aspirin 81mg  daily only.    RECOMMENDATIONS   - Patient  deciding if he wants to proceed with ACDF (neurosurgery and neurology both recommend the surgery) - If surgery is performed then aspirin should be held and restarted 2 weeks after surgery if cleared by neurosurgery to do so.  It should then be continued as dual antiplatelet therapy in conjunction with aspirin 81 mg until patient is 90 days from TIA at which point patient can be transitioned to aspirin 81mg  daily only.   - Additional care should be taken during surgery to avoid hypotension given L ICA cavernous stenosis and R vertebral occlusion - Neurology will be available prn for questions going forward. ______________________________________________________________________    Signed, Jefferson Fuel, MD [facility]

## 2023-10-17 ENCOUNTER — Encounter
Admission: EM | Disposition: A | Discharge status: Home / Self Care | Payer: Self-pay | Source: Home / Self Care | Attending: Obstetrics and Gynecology

## 2023-10-17 ENCOUNTER — Other Ambulatory Visit: Payer: Self-pay

## 2023-10-17 ENCOUNTER — Encounter: Payer: Self-pay | Admitting: Certified Registered Nurse Anesthetist

## 2023-10-17 DIAGNOSIS — G459 Transient cerebral ischemic attack, unspecified: Secondary | ICD-10-CM | POA: Diagnosis not present

## 2023-10-17 LAB — GLUCOSE, CAPILLARY: Glucose-Capillary: 237 mg/dL — ABNORMAL HIGH (ref 70–99)

## 2023-10-17 SURGERY — ANTERIOR CERVICAL DECOMPRESSION/DISCECTOMY FUSION 1 LEVEL
Anesthesia: Choice

## 2023-10-17 MED ORDER — FENTANYL CITRATE (PF) 100 MCG/2ML IJ SOLN
INTRAMUSCULAR | Status: AC
Start: 2023-10-17 — End: ?
  Filled 2023-10-17: qty 2

## 2023-10-17 MED ORDER — LIDOCAINE HCL (PF) 2 % IJ SOLN
INTRAMUSCULAR | Status: AC
  Filled 2023-10-17: qty 5

## 2023-10-17 MED ORDER — SUCCINYLCHOLINE CHLORIDE 200 MG/10ML IV SOSY
PREFILLED_SYRINGE | INTRAVENOUS | Status: AC
  Filled 2023-10-17: qty 10

## 2023-10-17 MED ORDER — ASPIRIN 81 MG PO TBEC
81.0000 mg | DELAYED_RELEASE_TABLET | Freq: Every day | ORAL | 2 refills | Status: AC
  Filled 2023-10-17: qty 120, 120d supply, fill #0

## 2023-10-17 MED ORDER — REMIFENTANIL HCL 1 MG IV SOLR
INTRAVENOUS | Status: AC
Start: 2023-10-17 — End: ?
  Filled 2023-10-17: qty 1000

## 2023-10-17 MED ORDER — PHENYLEPHRINE HCL-NACL 20-0.9 MG/250ML-% IV SOLN
INTRAVENOUS | Status: AC
  Filled 2023-10-17: qty 250

## 2023-10-17 MED ORDER — MIDAZOLAM HCL 2 MG/2ML IJ SOLN
INTRAMUSCULAR | Status: AC
  Filled 2023-10-17: qty 2

## 2023-10-17 MED ORDER — CLOPIDOGREL BISULFATE 75 MG PO TABS
75.0000 mg | ORAL_TABLET | Freq: Every day | ORAL | 0 refills | Status: AC
  Filled 2023-10-17: qty 90, 90d supply, fill #0

## 2023-10-17 MED ORDER — ONDANSETRON HCL 4 MG/2ML IJ SOLN
INTRAMUSCULAR | Status: AC
  Filled 2023-10-17: qty 2

## 2023-10-17 MED ORDER — DEXAMETHASONE SODIUM PHOSPHATE 10 MG/ML IJ SOLN
INTRAMUSCULAR | Status: AC
  Filled 2023-10-17: qty 1

## 2023-10-17 NOTE — Progress Notes (Signed)
 PT Cancellation Note  Patient Details Name: KYO COCUZZA MRN: [id number] DOB: [birth year]   Cancelled Treatment:    Reason Eval/Treat Not Completed: Other (comment). Pt currently scheduled for Sx this date. Will cancel current order at this time. Please re-order PT once medically stable.   Annalia Metzger 10/17/2023, 8:33 AM Elizabeth Palau, PT, DPT, GCS [phone]

## 2023-10-17 NOTE — Evaluation (Signed)
 Physical Therapy Evaluation Patient Details Name: Wesley Bradford MRN: [id number] DOB: [birth year] Today's Date: 10/17/2023  History of Present Illness  Pt is a 58 y.o. male  who presents to the ED due to dysarthria. MRI and CT head negative. 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 PMH of DM2, hypertension, hyperlipidemia, chronic pain syndrome, recurrent pancreatitis, spinal stenosis. Pt declined surgical intervention at this time. Cleared for participation via MD.  Clinical Impression  Pt is a pleasant 58 year old male who was admitted for acute CVA. Noted R vertebral artery dissection, scheduled for Sx intervention this date. Pt has changed his mind and is agreeable to manage with conservative treatment. MD cleared for participation in PT consult. Pt demonstrates all bed mobility/transfers/ambulation at baseline level. Reports he has all DME at home and can use accordingly. Able to ambulate in hallway this date improving in balance with continued ambulation. He declined stair training at this time. Pt does not require any further PT needs at this time. Pt will be dc in house and does not require follow up. RN aware. Will dc current orders.       If plan is discharge home, recommend the following: A little help with walking and/or transfers;Help with stairs or ramp for entrance   Can travel by private vehicle        Equipment Recommendations None recommended by PT  Recommendations for Other Services       Functional Status Assessment Patient has not had a recent decline in their functional status     Precautions / Restrictions Precautions Precautions: Fall Recall of Precautions/Restrictions: Intact Restrictions Weight Bearing Restrictions Per Provider Order: No      Mobility  Bed Mobility Overal bed mobility: Independent             General bed mobility comments: safe technique with ease of mobility    Transfers Overall transfer  level: Needs assistance Equipment used: Quad cane Transfers: Sit to/from Stand, Bed to chair/wheelchair/BSC Sit to Stand: Contact guard assist           General transfer comment: slightly impulsive. Reports no dizziness or neuro symptoms. Asymmetrical posture    Ambulation/Gait Ambulation/Gait assistance: Contact guard assist, Min assist Gait Distance (Feet): 150 Feet Assistive device: Quad cane Gait Pattern/deviations: Step-through pattern       General Gait Details: initially unsteadiness with very slow gait. Quad cane used and able to progress to reciprocal gait with Herbalist     Tilt Bed    Modified Rankin (Stroke Patients Only)       Balance Overall balance assessment: Needs assistance Sitting-balance support: Feet supported Sitting balance-Leahy Scale: Good     Standing balance support: Single extremity supported Standing balance-Leahy Scale: Fair                               Pertinent Vitals/Pain Pain Assessment Pain Assessment: Faces Faces Pain Scale: Hurts a little bit Pain Location: back - chronic Pain Descriptors / Indicators: Aching Pain Intervention(s): Limited activity within patient's tolerance    Home Living Family/patient expects to be discharged to:: Private residence Living Arrangements: Spouse/significant other Available Help at Discharge: Family;Available 24 hours/day Type of Home: Mobile home Home Access: Stairs to enter Entrance Stairs-Rails: Right;Left Entrance Stairs-Number of Steps: 4-5 STE   Home Layout: One level Home Equipment: Shower  seat - built in;Grab bars - tub/shower;Rolling Walker (2 wheels);Rollator (4 wheels);Cane - single point;Cane - quad      Prior Function Prior Level of Function : Independent/Modified Independent;Driving             Mobility Comments: occasional use of SPC or QCin the home, but typically IND and used devices when going out ADLs  Comments: IND/MOD I     Extremity/Trunk Assessment   Upper Extremity Assessment Upper Extremity Assessment: Generalized weakness (R UE grossly 3-/5; L UE grossly 4/5)    Lower Extremity Assessment Lower Extremity Assessment: Generalized weakness (R LE grossly 3+/5; L LE grossly 4/5)       Communication   Communication Communication: No apparent difficulties    Cognition Arousal: Alert Behavior During Therapy: WFL for tasks assessed/performed   PT - Cognitive impairments: No apparent impairments                       PT - Cognition Comments: very pleasant and agreeable to session Following commands: Intact       Cueing Cueing Techniques: Verbal cues     General Comments      Exercises     Assessment/Plan    PT Assessment All further PT needs can be met in the next venue of care  PT Problem List Decreased strength;Decreased activity tolerance;Decreased balance;Decreased mobility;Decreased coordination       PT Treatment Interventions      PT Goals (Current goals can be found in the Care Plan section)  Acute Rehab PT Goals Patient Stated Goal: to go home PT Goal Formulation: All assessment and education complete, DC therapy Time For Goal Achievement: 10/17/23 Potential to Achieve Goals: Good    Frequency       Co-evaluation               AM-PAC PT "6 Clicks" Mobility  Outcome Measure Help needed turning from your back to your side while in a flat bed without using bedrails?: None Help needed moving from lying on your back to sitting on the side of a flat bed without using bedrails?: None Help needed moving to and from a bed to a chair (including a wheelchair)?: None Help needed standing up from a chair using your arms (e.g., wheelchair or bedside chair)?: A Little Help needed to walk in hospital room?: A Little Help needed climbing 3-5 steps with a railing? : A Little 6 Click Score: 21    End of Session Equipment Utilized During  Treatment: Gait belt Activity Tolerance: Patient tolerated treatment well Patient left: in bed (left seated on EOB and sat up with breakfast) Nurse Communication: Mobility status PT Visit Diagnosis: Unsteadiness on feet (R26.81);Muscle weakness (generalized) (M62.81);Difficulty in walking, not elsewhere classified (R26.2)    Time: 1610-9604 PT Time Calculation (min) (ACUTE ONLY): 15 min   Charges:   PT Evaluation $PT Eval Low Complexity: 1 Low   PT General Charges $$ ACUTE PT VISIT: 1 Visit         Elizabeth Palau, PT, DPT, GCS [phone]   Zaron Zwiefelhofer 10/17/2023, 10:19 AM

## 2023-10-17 NOTE — Plan of Care (Signed)

## 2023-10-17 NOTE — Progress Notes (Signed)
 I had a long conversation with Wesley Bradford and his wife this morning regarding him declining to move forward with surgery this morning.  He states he woke up in a panic this morning and is concerned as he is still having ongoing back pain and does not want to also have the pain from neck surgery.  I asked him orientation questions and he was fully oriented.  I asked him to reiterate the risks of not moving forward with surgery which he was able to do clearly and concisely.  I reiterated that not moving forward with surgery could result in further neurologic decline, possible paralysis, permanent nerve damage, and pain.  He again reiterated that he is willing to accept these possible risks to avoid going forward with surgery. We will plan to move forward with outpatient follow-up and cancel this morning surgery.  Manning Charity PA-C

## 2023-10-17 NOTE — Plan of Care (Signed)
 Problem: Ischemic Stroke/TIA Tissue Perfusion: Goal: Complications of ischemic stroke/TIA will be minimized Outcome: Progressing  Problem: Education: Goal: Knowledge of disease or condition will improve Outcome: Progressing Goal: Knowledge of secondary prevention will improve  Outcome: Progressing Goal: Knowledge of patient specific risk factors will improve  Outcome: Progressing  Problem: Coping: Goal: Will verbalize positive feelings about self Outcome: Progressing Goal: Will identify appropriate support needs Outcome: Progressing   Problem: Health Behavior/Discharge Planning: Goal: Ability to manage health-related needs will improve Outcome: Progressing Goal: Goals will be collaboratively established with patient/family Outcome: Progressing  Problem: Self-Care: Goal: Ability to participate in self-care as condition permits will improve Outcome: Progressing Goal: Verbalization of feelings and concerns over difficulty with self-care will improve Outcome: Progressing Goal: Ability to communicate needs accurately will improve Outcome: Progressing  Problem: Nutrition: Goal: Risk of aspiration will decrease Outcome: Progressing Goal: Dietary intake will improve Outcome: Progressing

## 2023-10-17 NOTE — Progress Notes (Signed)
 OT Cancellation Note  Patient Details Name: Wesley Bradford MRN: [id number] DOB: 06-Sep-1965   Cancelled Treatment:    Reason Eval/Treat Not Completed: Patient not medically ready.  Pt currently scheduled for Sx this date. Will cancel current order at this time. Please re-order OT once medically stable.    Constance Goltz 10/17/2023, 8:35 AM

## 2023-10-17 NOTE — Discharge Summary (Signed)
 Wesley Bradford LKG:[id number] DOB: 11-Jan-1966 DOA: 10/14/2023  PCP: Fannie Knee, MD  Admit date: 10/14/2023 Discharge date: 10/17/2023  Time spent: 353 minutes  Recommendations for Outpatient Follow-up:  Neurosurgery f/u as scheduled Close pcp hospital f/u     Discharge Diagnoses:  Active Problems:   Acute CVA (cerebrovascular accident) (HCC)   Type II diabetes mellitus (HCC)   Chronic pain syndrome   Hypertension, essential   Depression   Class 1 obesity   Cervical spinal stenosis   Cervical myelopathy (HCC)   TIA (transient ischemic attack)   Discharge Condition: stable  Diet recommendation: carb modified  Filed Weights   10/14/23 1016  Weight: 95.3 kg    History of present illness:  From admission h and p Wesley Bradford is a 58 y.o. male with medical history significant of type 2 diabetes, hypertension, hyperlipidemia, chronic pain syndrome, recurrent pancreatitis, spinal stenosis, who presents to the ED due to dysarthria.   Wesley Bradford states that last night at approximately 2230, his wife noticed that his speech seemed "off" and he was having difficulty finding his words.  He ended up going to bed but this morning, his wife told him that his speech seems worse.  They called Wesley Bradford who was concerned for significant slurring of the words and called 911.  Wesley Bradford denies noting any focal weakness, dizziness, vision changes, chest pain, shortness of breath, palpitations.  He denies any recent illness including congestion, cough, runny nose, nausea, vomiting, diarrhea.  He denies any prior history of intracranial abnormality.   At this time, Wesley Bradford Bradford states his speech seems nearly back to normal but he still struggling to find his words.    Hospital Course:  Patient presents with transient dysarthria. Neuroimaging negative for acute stroke. Evaluated by neurology, advises treat this as TIA, with DAPT for 90 days followed by aspirin  monotherapy. Stroke w/u reveals chronic right vertebral artery dissection not thought to be the cause of his presenting symptoms. Patient also with several months myelopathy symptoms and cervical imaging consistent with stenosis with cord compression. Neurosurgery consulted and advised decompressive surgery, this was scheduled for 4/10 but patient changed his mind and now refuses surgery despite our strong recommendations to proceed and the risk of progressive and permanent neurologic dysfunction which was carefully explained to the patient. PT evaluated and advises outpt PT which the patient declines. Outpt neurosurgery f/u has been scheduled, also advise close pcp f/u. Referred to neurology.   Procedures: none   Consultations: Neurology, neurosurgery  Discharge Exam: Vitals:   10/17/23 0449 10/17/23 0728  BP: (!) 144/82 120/88  Pulse: 60 71  Resp: 18 14  Temp: 97.9 F (36.6 C) 98.1 F (36.7 C)  SpO2: 100% 99%    General exam: Appears calm and comfortable  Respiratory system: Clear to auscultation. Respiratory effort normal. Cardiovascular system: S1 & S2 heard, RRR. No JVD, murmurs, rubs, gallops or clicks. No pedal edema. Gastrointestinal system: Abdomen is nondistended, soft and nontender. No organomegaly or masses felt. Normal bowel sounds heard. Central nervous system: Alert and oriented.  right arm weakness and right leg weakness Extremities: Symmetric 5 x 5 power. Skin: No rashes, lesions or ulcers Psychiatry: Judgement and insight appear normal. Mood & affect appropriate.   Discharge Instructions   Discharge Instructions     Diet Carb Modified   Complete by: As directed    Increase activity slowly   Complete by: As directed       Allergies as  of 10/17/2023   No Known Allergies      Medication List     STOP taking these medications    predniSONE 10 MG (21) Tbpk tablet Commonly known as: STERAPRED UNI-PAK 21 TAB       TAKE these medications    aspirin  EC 81 MG tablet Take 1 tablet (81 mg total) by mouth daily. Take after 12 weeks for prevention of preeclampsia later in pregnancy   atorvastatin 80 MG tablet Commonly known as: LIPITOR Take 80 mg by mouth daily.   calcium carbonate 500 MG chewable tablet Commonly known as: TUMS - dosed in mg elemental calcium Chew 1 tablet by mouth daily.   clopidogrel 75 MG tablet Commonly known as: Plavix Take 1 tablet (75 mg total) by mouth daily.   HYDROcodone-acetaminophen 10-325 MG tablet Commonly known as: NORCO Take 1 tablet by mouth every 8 (eight) hours as needed.   Januvia 100 MG tablet Generic drug: sitaGLIPtin Take 100 mg by mouth daily.   lisinopril 40 MG tablet Commonly known as: ZESTRIL Take 40 mg by mouth daily.   metFORMIN 1000 MG tablet Commonly known as: GLUCOPHAGE Take 1,000 mg by mouth 2 (two) times daily.   multivitamin tablet Take 1 tablet by mouth daily.   tadalafil 5 MG tablet Commonly known as: CIALIS Take 5 mg by mouth daily.   tamsulosin 0.4 MG Caps capsule Commonly known as: FLOMAX Take 0.4 mg by mouth daily. At bedtime       No Known Allergies  Follow-up Information     Zipkin, Daniella A, MD Follow up.   Specialty: Internal Medicine Why: hospital follow up Contact information: [address] [place] [address] [phone]                  The results of significant diagnostics from this hospitalization (including imaging, microbiology, ancillary and laboratory) are listed below for reference.    Significant Diagnostic Studies: ECHOCARDIOGRAM COMPLETE Result Date: 10/15/2023    ECHOCARDIOGRAM REPORT   Patient Name:   Wesley Bradford Date of Exam: 10/15/2023 Medical Rec #:  [id number]        Height:       70.0 in Accession #:    [id number]       Weight:       210.1 lb Date of Birth:  [birth year]       BSA:          2.131 m Patient Age:    57 years         BP:           108/49 mmHg Patient Gender: M                HR:           80  bpm. Exam Location:  [facility] Procedure: 2D Echo, Cardiac Doppler, Color Doppler, Strain Analysis and Saline            Contrast Bubble Study (Both Spectral and Color Flow Doppler were            utilized during procedure). Indications:     Stroke  History:         Patient has no prior history of Echocardiogram examinations.                  Stroke; Risk Factors:Hypertension, Diabetes and Dyslipidemia.  Sonographer:     Mikki Harbor Referring Phys:  [id number] Verdene Lennert Diagnosing Phys: Yvonne Kendall MD  Sonographer  Comments: Global longitudinal strain was attempted. IMPRESSIONS  1. Left ventricular ejection fraction, by estimation, is 55 to 60%. The left ventricle has normal function. The left ventricle has no regional wall motion abnormalities. There is mild left ventricular hypertrophy. Left ventricular diastolic parameters were normal.  2. Right ventricular systolic function is normal. The right ventricular size is normal.  3. The mitral valve is normal in structure. Mild mitral valve regurgitation. No evidence of mitral stenosis.  4. The aortic valve is tricuspid. There is moderate thickening of the aortic valve. Aortic valve regurgitation is not visualized. Aortic valve sclerosis is present, with no evidence of aortic valve stenosis.  5. Agitated saline contrast bubble study was negative, with no evidence of any interatrial shunt. FINDINGS  Left Ventricle: Left ventricular ejection fraction, by estimation, is 55 to 60%. The left ventricle has normal function. The left ventricle has no regional wall motion abnormalities. Global longitudinal strain performed but not reported based on interpreter judgement due to suboptimal tracking. The left ventricular internal cavity size was normal in size. There is mild left ventricular hypertrophy. Left ventricular diastolic parameters were normal. Right Ventricle: The right ventricular size is normal. No increase in right ventricular wall thickness. Right ventricular  systolic function is normal. Left Atrium: Left atrial size was normal in size. Right Atrium: Right atrial size was normal in size. Pericardium: There is no evidence of pericardial effusion. Mitral Valve: The mitral valve is normal in structure. Mild mitral valve regurgitation. No evidence of mitral valve stenosis. MV peak gradient, 2.2 mmHg. The mean mitral valve gradient is 1.0 mmHg. Tricuspid Valve: The tricuspid valve is normal in structure. Tricuspid valve regurgitation is not demonstrated. Aortic Valve: The aortic valve is tricuspid. There is moderate thickening of the aortic valve. Aortic valve regurgitation is not visualized. Aortic valve sclerosis is present, with no evidence of aortic valve stenosis. Aortic valve mean gradient measures  4.0 mmHg. Aortic valve peak gradient measures 8.8 mmHg. Aortic valve area, by VTI measures 1.80 cm. Pulmonic Valve: The pulmonic valve was grossly normal. Pulmonic valve regurgitation is not visualized. No evidence of pulmonic stenosis. Aorta: The aortic root is normal in size and structure. Pulmonary Artery: The pulmonary artery is not well seen. Venous: The inferior vena cava was not well visualized. IAS/Shunts: The interatrial septum was not well visualized. Agitated saline contrast was given intravenously to evaluate for intracardiac shunting. Agitated saline contrast bubble study was negative, with no evidence of any interatrial shunt.  LEFT VENTRICLE PLAX 2D LVIDd:         5.00 cm     Diastology LVIDs:         3.30 cm     LV e' medial:    8.81 cm/s LV PW:         1.10 cm     LV E/e' medial:  6.4 LV IVS:        1.30 cm     LV e' lateral:   9.36 cm/s LVOT diam:     2.00 cm     LV E/e' lateral: 6.0 LV SV:         48 LV SV Index:   23 LVOT Area:     3.14 cm  LV Volumes (MOD) LV vol d, MOD A2C: 63.1 ml LV vol d, MOD A4C: 67.6 ml LV vol s, MOD A2C: 26.8 ml LV vol s, MOD A4C: 29.7 ml LV SV MOD A2C:     36.3 ml LV SV MOD A4C:  67.6 ml LV SV MOD BP:      37.8 ml RIGHT  VENTRICLE RV Basal diam:  3.70 cm RV Mid diam:    3.80 cm RV S prime:     16.90 cm/s TAPSE (M-mode): 2.2 cm LEFT ATRIUM             Index        RIGHT ATRIUM           Index LA diam:        4.90 cm 2.30 cm/m   RA Area:     16.30 cm LA Vol (A2C):   48.3 ml 22.66 ml/m  RA Volume:   39.30 ml  18.44 ml/m LA Vol (A4C):   29.1 ml 13.65 ml/m LA Biplane Vol: 37.6 ml 17.64 ml/m  AORTIC VALVE                    PULMONIC VALVE AV Area (Vmax):    1.81 cm     PV Vmax:       0.96 m/s AV Area (Vmean):   1.76 cm     PV Peak grad:  3.7 mmHg AV Area (VTI):     1.80 cm AV Vmax:           148.00 cm/s AV Vmean:          84.200 cm/s AV VTI:            0.269 m AV Peak Grad:      8.8 mmHg AV Mean Grad:      4.0 mmHg LVOT Vmax:         85.10 cm/s LVOT Vmean:        47.200 cm/s LVOT VTI:          0.154 m LVOT/AV VTI ratio: 0.57  AORTA Ao Root diam: 3.40 cm MITRAL VALVE MV Area (PHT): 3.68 cm    SHUNTS MV Area VTI:   2.34 cm    Systemic VTI:  0.15 m MV Peak grad:  2.2 mmHg    Systemic Diam: 2.00 cm MV Mean grad:  1.0 mmHg MV Vmax:       0.74 m/s MV Vmean:      38.0 cm/s MV Decel Time: 206 msec MV E velocity: 56.00 cm/s MV A velocity: 51.60 cm/s MV E/A ratio:  1.09 Cristal Deer End MD Electronically signed by Yvonne Kendall MD Signature Date/Time: 10/15/2023/12:18:04 PM    Final    MR ANGIO NECK W WO CONTRAST Result Date: 10/14/2023 CLINICAL DATA:  Concern for right vertebral artery dissection. EXAM: MRA NECK WITHOUT AND WITH CONTRAST TECHNIQUE: Multiplanar and multiecho pulse sequences of the neck were obtained without and with intravenous contrast. Angiographic images of the neck were obtained using MRA technique without and with intravenous contrast. CONTRAST:  9mL GADAVIST GADOBUTROL 1 MMOL/ML IV SOLN COMPARISON:  Same day CTA head and neck. FINDINGS: The visualized aortic arch is unremarkable. Common origin of the brachiocephalic and left common carotid arteries. Bilateral subclavian arteries are patent. The right carotid  artery is patent from the origin to the skull base. The carotid bifurcation is widely patent. Left carotid artery is patent from the origin to the skull base. The left carotid bifurcation is widely patent. The left vertebral artery is patent from its origin on the left subclavian artery to the vertebrobasilar confluence. The right vertebral artery is occluded at the origin. There are few scattered areas reconstitution of the right V2 and V3 segments with multifocal occlusion. There is  occlusion of the distal V3/proximal V4 segment of the right vertebral artery. Additional reconstitution of the right V4 segment with a ventral occlusion of the distal V4 segment. Small caliber and multifocal irregularity of the visualized portions of the right vertebral artery. IMPRESSION: Redemonstrated occlusion of the right vertebral artery at its origin with multifocal reconstitution and occlusion of the vessel extending to the distal V4 segment. Findings concerning for right vertebral artery dissection. Arterial vasculature in the neck is otherwise patent. Electronically Signed   By: Emily Filbert M.D.   On: 10/14/2023 19:55   MR CERVICAL SPINE WO CONTRAST Result Date: 10/14/2023 CLINICAL DATA:  Acute myelopathy. EXAM: MRI CERVICAL SPINE WITHOUT CONTRAST TECHNIQUE: Multiplanar, multisequence MR imaging of the cervical spine was performed. No intravenous contrast was administered. COMPARISON:  Same day CTA head and neck. FINDINGS: Alignment: Straightening of the normal cervical lordosis. Approximately 3 mm anterolisthesis of C4 on C5. Vertebrae: No bone marrow edema or evidence of acute fracture. T2/STIR hyperintense lesion within the right posterior aspect of the C7 vertebral body which may reflect a lipid poor hemangioma. Schmorl's nodes and chronic endplate irregularity at multiple levels. Cord: Decreased caliber of the cervical cord at C4-5 with increased T2/STIR signal concerning for myelomalacia versus edema. The cervical  cord is otherwise normal in caliber and signal intensity. Posterior Fossa, vertebral arteries, paraspinal tissues: The visualized posterior fossa structures are unremarkable. There is mild prevertebral edema from C2-C4. Small effusion involving the right atlantooccipital articulation. Paraspinal soft tissues are otherwise unremarkable. Disc levels: C2-3: Small disc bulge. No significant spinal canal stenosis. No significant foraminal stenosis. Mild bilateral facet arthrosis. C3-4: Disc osteophyte complex which indents the ventral thecal sac without contacting the spinal cord. Bilateral facet arthrosis. Uncovertebral hypertrophy more pronounced on the left. Mild right and mild-to-moderate left foraminal stenosis. C4-5: Trace anterolisthesis with partial uncovering of the disc. Disc osteophyte complex which indents the ventral cervical cord with associated cord compression. Thickening of the ligamentum flavum indents the dorsal thecal sac. Severe spinal canal stenosis and cord compression with associated cord signal abnormality. Bilateral facet arthrosis and uncovertebral hypertrophy. Severe bilateral foraminal stenosis. C5-6: Disc osteophyte complex which indents the ventral thecal sac with subtle flattening of the ventral cervical cord. Bilateral facet arthrosis. Mild bilateral foraminal stenosis. C6-7: Mild disc height loss. Disc osteophyte complex indents the ventral thecal sac with subtle flattening of the ventral cervical cord. Bilateral facet arthrosis. Uncovertebral hypertrophy on the right. Mild-to-moderate right and mild left foraminal stenosis. C7-T1: Disc osteophyte complex indents the ventral thecal sac without contacting the spinal cord. Bilateral facet arthrosis. Moderate bilateral foraminal stenosis. IMPRESSION: Degenerative changes of the cervical spine as above. Disc osteophyte complex and thickening of the ligamentum flavum at C4-5 contributing to severe spinal canal stenosis. Cord compression at  C4-5 with associated cord signal abnormality concerning for myelomalacia versus edema. Prevertebral edema from C2-C4, likely secondary to degenerative changes versus mild ligamentous injury. Multilevel foraminal stenosis, greatest and severe bilaterally at C4-5. Small effusion involving the right atlantooccipital articulation. Electronically Signed   By: Emily Filbert M.D.   On: 10/14/2023 19:49   CT ANGIO HEAD NECK W WO CM Addendum Date: 10/14/2023 ADDENDUM REPORT: 10/14/2023 17:08 ADDENDUM: These results were called by telephone at the time of interpretation on 10/14/2023 at 5:08 pm to provider Verdene Lennert , who verbally acknowledged these results. Electronically Signed   By: Emily Filbert M.D.   On: 10/14/2023 17:08   Result Date: 10/14/2023 CLINICAL DATA:  Neuro deficit, concern for stroke,  dysarthria. EXAM: CT ANGIOGRAPHY HEAD AND NECK WITH AND WITHOUT CONTRAST TECHNIQUE: Multidetector CT imaging of the head and neck was performed using the standard protocol during bolus administration of intravenous contrast. Multiplanar CT image reconstructions and MIPs were obtained to evaluate the vascular anatomy. Carotid stenosis measurements (when applicable) are obtained utilizing NASCET criteria, using the distal internal carotid diameter as the denominator. RADIATION DOSE REDUCTION: This exam was performed according to the departmental dose-optimization program which includes automated exposure control, adjustment of the mA and/or kV according to patient size and/or use of iterative reconstruction technique. CONTRAST:  75mL OMNIPAQUE IOHEXOL 350 MG/ML SOLN COMPARISON:  Same day CT head and MRI head. FINDINGS: CTA NECK FINDINGS Aortic arch: Common origin of the brachiocephalic and left common carotid arteries. Imaged portion shows no evidence of aneurysm or dissection. Mild atherosclerosis. No significant stenosis of the major arch vessel origins. Pulmonary arteries: As permitted by contrast timing, there are no  filling defects in the visualized pulmonary arteries. Subclavian arteries: The subclavian arteries are patent bilaterally. Right carotid system: No evidence of dissection, stenosis (50% or greater), or occlusion. Left carotid system: No evidence of dissection, stenosis (50% or greater), or occlusion. Vertebral arteries: The left vertebral artery is dominant and is patent from the origin to the vertebrobasilar confluence. The right vertebral artery is occluded at the origin. There is focal reconstitution of the right V2 segment at the level of C5. Additional multifocal reconstitution of the distal V2 and V3 segments of the right vertebral artery with significantly diminished intraluminal contrast. The vessel appears occluded at the distal V3 segment. There is reconstitution of the mid V4 segment with likely occlusion of the distal V4 segment. Skeleton: No acute findings. Degenerative changes in the cervical spine. Other neck: The visualized airway is patent. No cervical lymphadenopathy. Upper chest: Visualized lung apices are clear. Review of the MIP images confirms the above findings CTA HEAD FINDINGS ANTERIOR CIRCULATION: The intracranial ICAs are patent bilaterally. Atherosclerosis involving the right petrous segment resulting in mild-to-moderate stenosis. Additional prominent atherosclerotic plaque involving the left lacerum segment with focal severe stenosis. Additional similar focal atherosclerotic plaque involving the lacerum segment and posterior genu of the left cavernous ICA resulting in focal severe stenosis. No proximal occlusion, aneurysm, or vascular malformation. MCAs: The middle cerebral arteries are patent bilaterally. ACAs: The anterior cerebral arteries are patent bilaterally. POSTERIOR CIRCULATION: No significant stenosis, proximal occlusion, aneurysm, or vascular malformation. PCAs: The posterior cerebral arteries are patent bilaterally. Pcomm: Not well visualized. SCAs: The superior cerebellar  arteries are patent bilaterally. Basilar artery: Patent AICAs: Visualized on the right. PICAs: Visualized on the left. Venous sinuses: As permitted by contrast timing, patent. Anatomic variants: None Review of the MIP images confirms the above findings IMPRESSION: Occlusion of the right vertebral artery at the origin with multifocal reconstitution and occlusion of the vessel from the V2 segment to the distal V4 segment concerning for vertebral artery dissection. Consider contrast enhanced MRA neck for further evaluation. Arterial vasculature in the neck is otherwise patent. Atherosclerosis involving the lacerum segments of the bilateral ICAs as well as the posterior genu of the left cavernous ICA resulting in focal severe stenosis. Intracranial vasculature is otherwise patent. Electronically Signed: By: Emily Filbert M.D. On: 10/14/2023 16:51   MR BRAIN WO CONTRAST Result Date: 10/14/2023 CLINICAL DATA:  Stroke-like symptoms. Slurred speech, gait abnormality, and abnormal movements. EXAM: MRI HEAD WITHOUT CONTRAST TECHNIQUE: Multiplanar, multiecho pulse sequences of the brain and surrounding structures were obtained without intravenous contrast. COMPARISON:  Head CT 10/14/2023 FINDINGS: Brain: There is no evidence of an acute infarct, intracranial hemorrhage, mass, midline shift, or extra-axial fluid collection. Cerebral volume is normal. The ventricles are normal in size. Scattered small T2 hyperintensities in the cerebral white matter bilaterally are nonspecific but compatible with minimal chronic small vessel ischemic disease. Vascular: Major intracranial vascular flow voids are preserved. Skull and upper cervical spine: Unremarkable bone marrow signal. Sinuses/Orbits: Bilateral cataract extraction. Mild posterior right ethmoid air cell opacification as noted on CT. Clear mastoid air cells. Other: None. IMPRESSION: 1. No acute intracranial abnormality. 2. Minimal chronic small vessel ischemic disease.  Electronically Signed   By: Sebastian Ache M.D.   On: 10/14/2023 13:36   CT HEAD WO CONTRAST Result Date: 10/14/2023 CLINICAL DATA:  Neuro deficit, acute, stroke suspected. Slurred speech, gait abnormality, and abnormal movements. EXAM: CT HEAD WITHOUT CONTRAST TECHNIQUE: Contiguous axial images were obtained from the base of the skull through the vertex without intravenous contrast. RADIATION DOSE REDUCTION: This exam was performed according to the departmental dose-optimization program which includes automated exposure control, adjustment of the mA and/or kV according to patient size and/or use of iterative reconstruction technique. COMPARISON:  None Available. FINDINGS: Brain: There is no evidence of an acute infarct, intracranial hemorrhage, mass, midline shift, or extra-axial fluid collection. Cerebral volume is normal. The ventricles are normal in size. Vascular: No hyperdense vessel or unexpected calcification. Skull: No acute fracture or suspicious lesion. Sinuses/Orbits: Chronic appearing posterior right ethmoid air cell opacification. Clear mastoid air cells. Bilateral cataract extraction. Other: None. IMPRESSION: No evidence of acute intracranial abnormality. Electronically Signed   By: Sebastian Ache M.D.   On: 10/14/2023 10:50    Microbiology: No results found for this or any previous visit (from the past 240 hours).   Labs: Basic Metabolic Panel: Recent Labs  Lab 10/14/23 1026 10/15/23 0459  NA 137 136  K 3.9 3.9  CL 105 104  CO2 22 26  GLUCOSE 246* 173*  BUN 8 11  CREATININE 0.77 0.70  CALCIUM 9.6 9.3   Liver Function Tests: Recent Labs  Lab 10/14/23 1026  AST 23  ALT 19  ALKPHOS 87  BILITOT 0.9  PROT 7.2  ALBUMIN 4.2   No results for input(s): "LIPASE", "AMYLASE" in the last 168 hours. No results for input(s): "AMMONIA" in the last 168 hours. CBC: Recent Labs  Lab 10/14/23 1026 10/15/23 0459  WBC 4.4 4.7  NEUTROABS 2.4 1.7  HGB 14.4 13.4  HCT 42.9 39.7  MCV  83.3 84.6  PLT 288 255   Cardiac Enzymes: No results for input(s): "CKTOTAL", "CKMB", "CKMBINDEX", "TROPONINI" in the last 168 hours. BNP: BNP (last 3 results) No results for input(s): "BNP" in the last 8760 hours.  ProBNP (last 3 results) No results for input(s): "PROBNP" in the last 8760 hours.  CBG: Recent Labs  Lab 10/16/23 0747 10/16/23 1200 10/16/23 1658 10/16/23 2030 10/17/23 0728  GLUCAP 262* 202* 324* 96 237*       Signed:  Silvano Bilis MD.  [facility] 10/17/2023, 10:28 AM

## 2023-10-30 ENCOUNTER — Encounter: Admitting: Physician Assistant

## 2023-11-27 ENCOUNTER — Encounter: Admitting: Neurosurgery

## 2024-01-06 ENCOUNTER — Encounter: Admitting: Physician Assistant
